# Patient Record
Sex: Female | Born: 1950 | Race: White | Hispanic: No | Smoking: Never smoker
Health system: Southern US, Community
[De-identification: ages and names within clinical notes are randomized; demographics above are authoritative.]

## PROBLEM LIST (undated history)

## (undated) DIAGNOSIS — G35 Multiple sclerosis: Secondary | ICD-10-CM

## (undated) DIAGNOSIS — G43909 Migraine, unspecified, not intractable, without status migrainosus: Secondary | ICD-10-CM

## (undated) DIAGNOSIS — E119 Type 2 diabetes mellitus without complications: Secondary | ICD-10-CM

## (undated) DIAGNOSIS — G2581 Restless legs syndrome: Secondary | ICD-10-CM

## (undated) DIAGNOSIS — G4730 Sleep apnea, unspecified: Secondary | ICD-10-CM

## (undated) DIAGNOSIS — J45909 Unspecified asthma, uncomplicated: Secondary | ICD-10-CM

## (undated) DIAGNOSIS — M1990 Unspecified osteoarthritis, unspecified site: Secondary | ICD-10-CM

## (undated) DIAGNOSIS — K759 Inflammatory liver disease, unspecified: Secondary | ICD-10-CM

## (undated) DIAGNOSIS — G5793 Unspecified mononeuropathy of bilateral lower limbs: Secondary | ICD-10-CM

## (undated) DIAGNOSIS — K219 Gastro-esophageal reflux disease without esophagitis: Secondary | ICD-10-CM

## (undated) DIAGNOSIS — R42 Dizziness and giddiness: Secondary | ICD-10-CM

## (undated) DIAGNOSIS — K8080 Other cholelithiasis without obstruction: Secondary | ICD-10-CM

## (undated) DIAGNOSIS — R109 Unspecified abdominal pain: Secondary | ICD-10-CM

## (undated) DIAGNOSIS — K8010 Calculus of gallbladder with chronic cholecystitis without obstruction: Secondary | ICD-10-CM

## (undated) DIAGNOSIS — N649 Disorder of breast, unspecified: Secondary | ICD-10-CM

## (undated) DIAGNOSIS — M5431 Sciatica, right side: Secondary | ICD-10-CM

## (undated) DIAGNOSIS — J029 Acute pharyngitis, unspecified: Secondary | ICD-10-CM

## (undated) HISTORY — PX: CHOLECYSTECTOMY: SHX55

## (undated) HISTORY — PX: SINUS EXPLORATION: SHX5214

## (undated) HISTORY — PX: APPENDECTOMY: SHX54

## (undated) HISTORY — PX: KNEE ARTHROSCOPY: SUR90

---

## 2008-08-16 ENCOUNTER — Ambulatory Visit: Payer: Self-pay | Admitting: Internal Medicine

## 2010-09-16 NOTE — Progress Notes (Signed)
See paper chart

## 2011-11-16 ENCOUNTER — Other Ambulatory Visit: Payer: Self-pay | Admitting: Bariatrics

## 2011-11-16 ENCOUNTER — Ambulatory Visit: Payer: Self-pay | Admitting: Bariatrics

## 2011-12-10 ENCOUNTER — Ambulatory Visit: Payer: Self-pay | Admitting: Bariatrics

## 2011-12-20 ENCOUNTER — Ambulatory Visit: Payer: Self-pay | Admitting: Bariatrics

## 2012-01-27 ENCOUNTER — Ambulatory Visit: Payer: Self-pay | Admitting: Gastroenterology

## 2012-02-03 ENCOUNTER — Ambulatory Visit: Payer: Self-pay | Admitting: Obstetrics and Gynecology

## 2012-02-23 ENCOUNTER — Ambulatory Visit: Payer: Self-pay | Admitting: Bariatrics

## 2012-03-01 ENCOUNTER — Inpatient Hospital Stay: Payer: Self-pay | Admitting: Bariatrics

## 2012-03-01 HISTORY — PX: ROUX-EN-Y GASTRIC BYPASS: SHX1104

## 2012-03-02 LAB — BASIC METABOLIC PANEL
Anion Gap: 7 (ref 7–16)
BUN: 10 mg/dL (ref 7–18)
Calcium, Total: 7.7 mg/dL — ABNORMAL LOW (ref 8.5–10.1)
Chloride: 105 mmol/L (ref 98–107)
Co2: 26 mmol/L (ref 21–32)
Creatinine: 0.74 mg/dL (ref 0.60–1.30)
EGFR (African American): >60
Glucose: 82 mg/dL (ref 65–99)
Osmolality: 274 (ref 275–301)
Potassium: 3.4 mmol/L — ABNORMAL LOW (ref 3.5–5.1)
Sodium: 138 mmol/L (ref 136–145)

## 2012-03-02 LAB — CBC WITH DIFFERENTIAL/PLATELET
Basophil #: 0.1 10*3/uL (ref 0.0–0.1)
Basophil %: 0.7 %
HGB: 12.2 g/dL (ref 12.0–16.0)
Lymphocyte #: 2.7 10*3/uL (ref 1.0–3.6)
Lymphocyte %: 26.2 %
MCH: 32.3 pg (ref 26.0–34.0)
MCV: 95 fL (ref 80–100)
Monocyte %: 8.1 %
Neutrophil #: 6.5 10*3/uL (ref 1.4–6.5)
Neutrophil %: 64 %
Platelet: 290 10*3/uL (ref 150–440)

## 2012-03-02 LAB — MAGNESIUM: Magnesium: 1.4 mg/dL — ABNORMAL LOW

## 2012-03-24 ENCOUNTER — Ambulatory Visit: Payer: Self-pay | Admitting: Bariatrics

## 2012-04-19 ENCOUNTER — Ambulatory Visit: Payer: Self-pay | Admitting: Bariatrics

## 2012-08-24 ENCOUNTER — Encounter

## 2012-12-04 NOTE — ED Notes (Signed)
Pt states that her nausea is better at this time but states that she is very cold and is requesting a warm blanket. Warm blanket given as requested. Pt denies other needs at this time. Will continue to monitor.     Chesley NoonHeather Brevin Mcfadden, RN  12/04/12 [phone]

## 2012-12-04 NOTE — ED Notes (Signed)
Pt states that she had chemo for breast cancer last Friday and developed nausea, vomiting x1 and diarrhea x15 today. Pt states that she feels very dehydrated. Pt states [facility]     Chesley NoonHeather Daelon Dunivan, RN  12/04/12 2110

## 2012-12-04 NOTE — ED Provider Notes (Signed)
Patient is a 62 year old female with breast cancer who presents with vomiting, diarrhea, and dehydration. The patient began having vomiting and diarrhea this morning.. She has had 15 diarrheal episodes and 1 vomiting episode today but had 3 vomiting episodes while in the ED. She also reports abdominal cramps.  She denies any abdominal pain.  She is currently receiving chemotherapy for breast cancer; she received her last treatment 9 days ago. She took zofran for her nausea symptoms but it has not relieved the symptoms. The patient has had nausea vomiting and diarrhea with her chemotherapy in the past but not to this extent.  She has taken Zofran 4 weeks.  Patient is on her fourth round of chemotherapy.    Patient's last chemotherapy was 9 days ago and her last Neulasta was 8 days ago patient is on Xarelto as she had she states to clots in her left arm and one clot around her port.  The ones in the arm have resolved she states that the one around her port stationary and they have no concerns about it but she is continued on Xarelto.       Patient is a 62 y.o. female presenting with nausea. The history is provided by the patient and the spouse. No language interpreter was used.   Nausea & Vomiting  Severity:  Moderate  Timing:  Constant  Number of daily episodes:  3  Progression:  Worsening  Chronicity:  New  Relieved by:  Nothing  Associated symptoms: diarrhea (15 times today)    Associated symptoms: no abdominal pain, no chills, no headaches and no sore throat        Review of Systems   Constitutional: Negative for fever and chills.   HENT: Negative for ear pain and sore throat.    Respiratory: Negative for shortness of breath.    Cardiovascular: Negative for chest pain.   Gastrointestinal: Positive for nausea, vomiting (1 time today) and diarrhea (15 times today). Negative for abdominal pain.   Genitourinary: Negative for dysuria and difficulty urinating.   Skin: Negative for rash.   Neurological: Negative for  syncope and headaches.   All other systems reviewed and are negative.          PAST MEDICAL HISTORY   has no past medical history on file.    PAST SURGICAL HISTORY   has no past surgical history on file.    FAMILY HISTORY  family history is not on file.    SOCIAL HISTORY       HOME MEDICATIONS     Prior to Admission medications    Not on File        ALLERGIES  is allergic to sulfa antibiotics.         Physical Exam   Constitutional: She is oriented to person, place, and time. She appears well-developed. No distress.   HENT:   Head: Normocephalic and atraumatic.   Right Ear: External ear normal.   Left Ear: External ear normal.   Mouth/Throat: Oropharynx is clear and moist. No oropharyngeal exudate, posterior oropharyngeal edema or posterior oropharyngeal erythema.   Eyes: Conjunctivae are normal. Pupils are equal, round, and reactive to light. Right eye exhibits no discharge. Left eye exhibits no discharge.   Neck: Normal range of motion. Neck supple. No JVD present.   Cardiovascular: Normal rate, regular rhythm, normal heart sounds and intact distal pulses.  Exam reveals no gallop and no friction rub.    No murmur heard.  Pulmonary/Chest: Effort normal  and breath sounds normal. No stridor. No respiratory distress. She has no wheezes. She has no rales.   Abdominal: Soft. Bowel sounds are normal. She exhibits no distension and no mass. There is no tenderness. There is no rigidity, no rebound and no guarding.   Musculoskeletal: Normal range of motion. She exhibits no edema or tenderness.   Neurological: She is alert and oriented to person, place, and time. She has normal strength. No cranial nerve deficit or sensory deficit. She exhibits normal muscle tone. GCS eye subscore is 4. GCS verbal subscore is 5. GCS motor subscore is 6.   Skin: Skin is warm and dry. No rash noted. She is not diaphoretic.        Psychiatric: She has a normal mood and affect. Her behavior is normal.       BP 144/96   Pulse 123   Temp(Src) 98.9  ??F (37.2 ??C) (Oral)   Resp 20   Ht 5\' 8"  (1.727 m)   Wt 145 lb (65.772 kg)   BMI 22.05 kg/m2   SpO2 98%    Procedures    MDM    Labs  Results for orders placed during the hospital encounter of 12/04/12   CBC WITH AUTO DIFFERENTIAL       Result Value Range    WBC 21.7 (*) 4.0 - 11.0 K/uL    RBC 3.35 (*) 4.00 - 5.20 M/uL    Hemoglobin 10.6 (*) 12.0 - 16.0 g/dL    Hematocrit 04.5 (*) 36.0 - 48.0 %    MCV 91.7  80.0 - 100.0 fL    MCH 31.6  26.0 - 34.0 pg    MCHC 34.5  31.0 - 36.0 g/dL    RDW 40.9 (*) 81.1 - 15.4 %    Platelets 404  135 - 450 K/uL    MPV 7.8  5.0 - 10.5 fL    Path Consult Yes      Neutrophils Relative 30.0      Lymphocytes Relative 20.0      Monocytes Relative 5.0      Eosinophils Relative Percent 0.0      Basophils Relative 0.0      Neutrophils Absolute 16.3 (*) 1.7 - 7.7 K/uL    Lymphocytes Absolute 4.3  1.0 - 5.1 K/uL    Monocytes Absolute 1.1  0.0 - 1.3 K/uL    Eosinophils Absolute 0.0  0.0 - 0.6 K/uL    Basophils Absolute 0.0  0.0 - 0.2 K/uL    Bands Relative 28 (*) 0 - 7 %    Metamyelocytes Relative 13 (*)     Myelocytes Relative 4 (*)     Toxic Granulation Present (*)     Anisocytosis 1+ (*)    COMPREHENSIVE METABOLIC PANEL       Result Value Range    Sodium 132 (*) 136 - 145 mmol/L    Potassium 3.1 (*) 3.5 - 5.1 mmol/L    Chloride 97 (*) 99 - 110 mmol/L    CO2 24  21 - 32 mmol/L    Glucose 160 (*) 70 - 99 mg/dL    BUN 15  7 - 20 mg/dL    CREATININE 1.1  0.6 - 1.2 mg/dL    GFR Non-African American 54 (*) >60    GFR African American >60  >60    Calcium 9.1  8.3 - 10.6 mg/dL    Total Protein 7.1  6.4 - 8.2 g/dL    Alb 4.0  3.4 - 5.0  g/dL    Albumin/Globulin Ratio 1.3  1.1 - 2.2    Total Bilirubin 0.3  0.0 - 1.0 mg/dL    Alkaline Phosphatase 129  40 - 129 U/L    ALT 13  10 - 40 U/L    AST 19  15 - 37 U/L    Globulin 3.1           Radiology      EKG Interpretation.        Emergency Department Course:  Patient has vomiting on arrival was given Zofran has recurrent vomiting and syncope and Phenergan  states that that has helped she does produce the diarrhea.  We discussed the possibility of colitis.  She declines CAT scan.  She has been pain-free in the emergency department and states that the only discomfort she's had at home is cramping sensation.   12:15 AM  Patient feels markedly improved.  She states that she has not had any further vomiting after the Phenergan and has no further diarrhea at this point.   12:50 AM  Patient continues to feel much improved is able to take by mouth fluids without any problems.  I discussed with the patient that I would call Dr. Selena Batten.  Her C. diff and leukocytes were negative.  I was concerned about her CBC and differential.  I would discuss this with Dr. Selena Batten.  If patient needs to be admitted she would like to be admitted to [facility] and is agreeable to transfer via squad.      1:09 AM  I spoke with Dr. Chipper Herb patient's presentation examination disposition were discussed and agreed upon.  I discussed my concern over the degree of bandemia.  I discussed whether or not she felt this was related to Neulasta she states that the patient doesn't have a fever usually she states they do not have a significant bandemia with Neulasta.  We discussed admission versus discharge home I felt given the significant bandemia patient would better be served by admission to the hospital.  Patient would like to be transferred to [facility] if she needs to be admitted.  We discussed this and Dr. Chipper Herb is new to this facility.  We discussed possible admission to the hospitalist with consult oncology.  I will speak to the hospitalist at crisis see if this is usual pattern.  Blood cultures will be done.    1:29 AM  She has had recurrent nausea and is given Phenergan.  She is quite agreeable with this time as she has had recurrence of her symptoms to being admitted and does wish to be admitted to [facility] she will consent to the blood cultures being drawn as she has a area with a significant  white count and bandemia I will go ahead and give her first dose of Cipro and Flagyl after cultures been obtained.    1:47 AM  Spoke with Dr. Chipper Herb again she is, was contacted by the bed person and she will call the oncology floor and patient will be signed to bed she will give orders.  I discussed with her the patient had recurrent nausea and is very willing to be admitted we have given her repeat Phenergan.  Also that I had ordered Cipro and Flagyl to cover the patient for the diarrhea until her cultures were available and she is also agreeable to this.      1:56 AM  Patient is better now after the second dose of Phenergan.  She again is agreeable  to admission and transfer she requests transfer to [facility].  Dr. Chipper Herb has made arrangements for her admission and this time we await bed assignment and transportation.  At this time care will be turned over to Dr. Rexene Agent patient's presentation examination and disposition and treatment have been discussed.  This document serves as a record of the services and decisions personally performed by myself, Cranston Neighbor, MD. It was created on my behalf by Sherrine Maples, a trained medical scribe. The creation of this document is based on my statements to the medical scribe. This dictation was generated by voice recognition computer software.  Although all attempts are made to edit the dictation for accuracy, there may be errors in the transcription that are not intended.              Cranston Neighbor, MD  12/05/12 2223

## 2012-12-04 NOTE — ED Notes (Signed)
Bedside commode at in room for pt if needed.     Chesley NoonHeather Tejal Monroy, RN  12/04/12 [phone]

## 2012-12-05 ENCOUNTER — Inpatient Hospital Stay: Admit: 2012-12-05 | Discharge: 2012-12-05 | Attending: Emergency Medicine

## 2012-12-05 LAB — CBC WITH AUTO DIFFERENTIAL
Bands Relative: 28 % — ABNORMAL HIGH (ref 0–7)
Basophils %: 0 %
Basophils Absolute: 0 10*3/uL (ref 0.0–0.2)
Eosinophils %: 0 %
Eosinophils Absolute: 0 10*3/uL (ref 0.0–0.6)
Hematocrit: 30.7 % — ABNORMAL LOW (ref 36.0–48.0)
Hemoglobin: 10.6 g/dL — ABNORMAL LOW (ref 12.0–16.0)
Lymphocytes %: 20 %
Lymphocytes Absolute: 4.3 10*3/uL (ref 1.0–5.1)
MCH: 31.6 pg (ref 26.0–34.0)
MCHC: 34.5 g/dL (ref 31.0–36.0)
MCV: 91.7 fL (ref 80.0–100.0)
MPV: 7.8 fL (ref 5.0–10.5)
Metamyelocytes Relative: 13 % — AB
Monocytes %: 5 %
Monocytes Absolute: 1.1 10*3/uL (ref 0.0–1.3)
Myelocyte Percent: 4 % — AB
Neutrophils %: 30 %
Neutrophils Absolute: 16.3 10*3/uL — ABNORMAL HIGH (ref 1.7–7.7)
Platelets: 404 10*3/uL (ref 135–450)
RBC: 3.35 M/uL — ABNORMAL LOW (ref 4.00–5.20)
RDW: 20.1 % — ABNORMAL HIGH (ref 12.4–15.4)
WBC: 21.7 10*3/uL — ABNORMAL HIGH (ref 4.0–11.0)

## 2012-12-05 LAB — COMPREHENSIVE METABOLIC PANEL
ALT: 13 U/L (ref 10–40)
AST: 19 U/L (ref 15–37)
Albumin/Globulin Ratio: 1.3 (ref 1.1–2.2)
Albumin: 4 g/dL (ref 3.4–5.0)
Alkaline Phosphatase: 129 U/L (ref 40–129)
BUN: 15 mg/dL (ref 7–20)
CO2: 24 mmol/L (ref 21–32)
Calcium: 9.1 mg/dL (ref 8.3–10.6)
Chloride: 97 mmol/L — ABNORMAL LOW (ref 99–110)
Creatinine: 1.1 mg/dL (ref 0.6–1.2)
GFR African American: >60 (ref >60)
GFR Non-African American: 54 — AB (ref >60)
Globulin: 3.1 g/dL
Glucose: 160 mg/dL — ABNORMAL HIGH (ref 70–99)
Potassium: 3.1 mmol/L — ABNORMAL LOW (ref 3.5–5.1)
Sodium: 132 mmol/L — ABNORMAL LOW (ref 136–145)
Total Bilirubin: 0.3 mg/dL (ref 0.0–1.0)
Total Protein: 7.1 g/dL (ref 6.4–8.2)

## 2012-12-05 LAB — PERIPHERAL BLOOD SMEAR, PATH REVIEW

## 2012-12-05 MED ORDER — LORAZEPAM 2 MG/ML IJ SOLN
2 MG/ML | Freq: Once | INTRAMUSCULAR | Status: AC
Start: 2012-12-05 — End: 2012-12-05
  Administered 2012-12-05: 07:00:00 via INTRAVENOUS

## 2012-12-05 MED ORDER — CIPROFLOXACIN IN D5W 400 MG/200ML IV SOLN
400 MG/200ML | Freq: Once | INTRAVENOUS | Status: AC
Start: 2012-12-05 — End: 2012-12-05
  Administered 2012-12-05: 08:00:00 via INTRAVENOUS

## 2012-12-05 MED ORDER — SODIUM CHLORIDE 0.9 % IV SOLN
0.9 % | INTRAVENOUS | Status: DC
Start: 2012-12-05 — End: 2012-12-05
  Administered 2012-12-05: 06:00:00 via INTRAVENOUS

## 2012-12-05 MED ORDER — PROMETHAZINE HCL 25 MG/ML IJ SOLN
25 MG/ML | Freq: Once | INTRAMUSCULAR | Status: AC
Start: 2012-12-05 — End: 2012-12-05
  Administered 2012-12-05: 06:00:00 via INTRAVENOUS

## 2012-12-05 MED ORDER — SODIUM CHLORIDE 0.9 % IV BOLUS
0.9 % | Freq: Once | INTRAVENOUS | Status: AC
Start: 2012-12-05 — End: 2012-12-04
  Administered 2012-12-05: 03:00:00 via INTRAVENOUS

## 2012-12-05 MED ORDER — METRONIDAZOLE IN NACL 5-0.79 MG/ML-% IV SOLN
5 mg/mL | Freq: Once | INTRAVENOUS | Status: AC
Start: 2012-12-05 — End: 2012-12-05
  Administered 2012-12-05: 07:00:00 via INTRAVENOUS

## 2012-12-05 MED ORDER — PROMETHAZINE HCL 25 MG/ML IJ SOLN
25 MG/ML | Freq: Once | INTRAMUSCULAR | Status: AC
Start: 2012-12-05 — End: 2012-12-04
  Administered 2012-12-05: 03:00:00 via INTRAVENOUS

## 2012-12-05 MED ADMIN — ondansetron (ZOFRAN) 4 MG/2ML injection: INTRAVENOUS | @ 02:00:00 | NDC 23155037831

## 2012-12-05 MED FILL — LORAZEPAM 2 MG/ML IJ SOLN: 2 MG/ML | INTRAMUSCULAR | Qty: 1

## 2012-12-05 MED FILL — METRONIDAZOLE IN NACL 5-0.79 MG/ML-% IV SOLN: 5 mg/mL | INTRAVENOUS | Qty: 100

## 2012-12-05 MED FILL — PROMETHAZINE HCL 25 MG/ML IJ SOLN: 25 MG/ML | INTRAMUSCULAR | Qty: 1

## 2012-12-05 MED FILL — CIPRO IN D5W 400 MG/200ML IV SOLN: 400 MG/200ML | INTRAVENOUS | Qty: 200

## 2012-12-05 MED FILL — ONDANSETRON HCL 4 MG/2ML IJ SOLN: 4 MG/2ML | INTRAMUSCULAR | Qty: 2

## 2012-12-05 NOTE — ED Notes (Signed)
Pt reports still feeling nauseated.  Plan to reevaluate, and draw cultures prior to antibiotics.    Marcille Buffy, RN  12/05/12 [phone]

## 2012-12-05 NOTE — ED Notes (Signed)
Report called Gena Fray.    Marcille Buffy, RN  12/05/12 [phone]

## 2012-12-05 NOTE — ED Notes (Signed)
After administering ordered dose of phenergan, explained to pt the need to draw blood cultures and that one set would be drawn from her port and the second she would have to be stuck.  Pt replied, " I just feel too sick right now to do it, and won't they do it at [facility] again?"  Informed Dr Ivan Croft of above info, she replied that the oncologist from [place] asked for them.  RN replied that a second attempt would be made to try and obtain cultures.    Marcille Buffy, RN  12/05/12 [phone]

## 2012-12-05 NOTE — ED Notes (Signed)
1 blood culture drawn from pt's port.    Marcille Buffy, RN  12/05/12 [phone]

## 2012-12-05 NOTE — ED Notes (Signed)
[facility] beds called back with bed number  Bed # 4081  Call [phone]    Zhara Gieske L. Eual Lindstrom  12/05/12 0203

## 2012-12-05 NOTE — ED Notes (Signed)
Pt given sprite and crackers. Pt states that she is feeling better and thinks that the phenergan has helped her more than the zofran did. Pt denies needs at this time. Husband remains at bedside. Call light in reach. Will continue to monitor.     Chesley NoonHeather Reyden Goupil, RN  12/05/12 0040

## 2012-12-07 LAB — ROTAVIRUS ANTIGEN, STOOL: Rotavirus: NEGATIVE

## 2013-03-28 ENCOUNTER — Encounter: Payer: Self-pay | Admitting: Neurology

## 2013-04-19 ENCOUNTER — Encounter: Payer: Self-pay | Admitting: Neurology

## 2013-05-12 NOTE — Progress Notes (Signed)
Subjective:      Patient ID: Vicki Hurst is a 63 y.o. female.    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  She was diagnosed with right breast cancer,s/p chemo,waiting for lumpectomy.  She developed a DVT at the site of the port and she has been on Xarelto.  She takes ativan periodically for panic disorder.  Review of Systems   Constitutional: Negative for fever, fatigue and unexpected weight change.   Respiratory: Negative for cough, chest tightness, shortness of breath and wheezing.    Cardiovascular: Negative for chest pain, palpitations and leg swelling.   Gastrointestinal: Negative for nausea, vomiting, abdominal pain, diarrhea and blood in stool.   Neurological: Negative for light-headedness.       Objective:   Physical Exam   Constitutional: She is oriented to person, place, and time. She appears well-developed and well-nourished.   HENT:   Head: Normocephalic and atraumatic.   Eyes: Pupils are equal, round, and reactive to light.   Neck: Normal range of motion. Neck supple. No thyromegaly present.   Cardiovascular: Normal rate, regular rhythm and normal heart sounds.  Exam reveals no gallop and no friction rub.    No murmur heard.  No carotid bruit   Pulmonary/Chest: Effort normal and breath sounds normal. No respiratory distress. She has no wheezes. She has no rales. She exhibits no tenderness.   Abdominal: Soft. Bowel sounds are normal. She exhibits no distension and no mass. There is no tenderness. There is no rebound and no guarding.   Musculoskeletal: She exhibits no edema.   Neurological: She is alert and oriented to person, place, and time.       Assessment:      1. DVT (deep venous thrombosis) (HCC)    2. Panic disorder             Plan:      Continue Xarelto.    Ativan 0.5 mg nightly prn #30 NRF.

## 2013-05-19 ENCOUNTER — Encounter: Payer: Self-pay | Admitting: Neurology

## 2013-06-07 NOTE — Telephone Encounter (Signed)
-----   Message from Daphane Shepherdara N Messina sent at 06/07/2013  2:51 PM EDT -----  Contact: Patient/[phone]      ----- Message -----     From: Janan Halteronnie E Hunt     Sent: 06/07/2013   2:39 PM       To: Daphane Shepherdara N Messina    Refill: Leaving for the weekend    Ativan 0.5 mg    [facility]    Lat appt  05/13/13  Future appt none

## 2013-06-07 NOTE — Telephone Encounter (Signed)
Too early

## 2013-06-09 NOTE — Telephone Encounter (Signed)
Ok

## 2013-06-09 NOTE — Telephone Encounter (Signed)
Last; 4/24

## 2013-06-09 NOTE — Telephone Encounter (Signed)
-----   Message from [facility] A Ranson sent at 06/09/2013  9:52 AM EDT -----  Contact: pt  She needs a refill on her ativan-[facility] at [phone]

## 2013-07-07 NOTE — Telephone Encounter (Signed)
-----   Message from TuttleBeth Braasch sent at 07/07/2013  9:22 AM EDT -----  Contact: Pharmacy  Patient needs a refill on Lorazepam 0.5 mg 1 hs.  Last seen on 05/12/13 and has no future appointment.  [facility] [phone]

## 2013-07-07 NOTE — Telephone Encounter (Signed)
Ok

## 2013-07-18 ENCOUNTER — Ambulatory Visit: Payer: Self-pay | Admitting: Internal Medicine

## 2013-07-26 ENCOUNTER — Encounter: Payer: Self-pay | Admitting: Internal Medicine

## 2013-08-02 MED ORDER — LORAZEPAM 0.5 MG PO TABS
0.5 MG | ORAL_TABLET | Freq: Every evening | ORAL | Status: DC
Start: 2013-08-02 — End: 2021-05-30

## 2013-08-02 NOTE — Telephone Encounter (Signed)
-----   Message from Garrett sent at 08/02/2013  9:14 AM EDT -----  Contact: [phone]  Refill:  Ativan    Pharmacy: [facility]/[phone]

## 2013-08-02 NOTE — Telephone Encounter (Signed)
Ok 1 rf

## 2013-08-19 ENCOUNTER — Encounter: Payer: Self-pay | Admitting: Internal Medicine

## 2013-09-19 ENCOUNTER — Encounter: Payer: Self-pay | Admitting: Internal Medicine

## 2013-12-07 ENCOUNTER — Ambulatory Visit: Payer: Self-pay | Admitting: Gastroenterology

## 2013-12-19 ENCOUNTER — Ambulatory Visit: Payer: Self-pay | Admitting: Gastroenterology

## 2014-05-04 ENCOUNTER — Other Ambulatory Visit: Payer: Self-pay | Admitting: Obstetrics and Gynecology

## 2014-05-04 DIAGNOSIS — Z1231 Encounter for screening mammogram for malignant neoplasm of breast: Secondary | ICD-10-CM

## 2014-05-10 ENCOUNTER — Emergency Department
Admit: 2014-05-10 | Disposition: A | Discharge status: Home / Self Care | Payer: Self-pay | Admitting: Emergency Medicine

## 2014-05-10 LAB — BASIC METABOLIC PANEL
Anion Gap: 7 (ref 7–16)
BUN: 9 mg/dL
CALCIUM: 8.6 mg/dL — AB
CHLORIDE: 100 mmol/L — AB
Co2: 32 mmol/L
Creatinine: 0.68 mg/dL
EGFR (African American): >60
EGFR (Non-African Amer.): >60
GLUCOSE: 227 mg/dL — AB
POTASSIUM: 4.5 mmol/L
SODIUM: 139 mmol/L

## 2014-05-10 LAB — CBC WITH DIFFERENTIAL/PLATELET
BASOS ABS: 0 10*3/uL (ref 0.0–0.1)
Basophil %: 0.3 %
Eosinophil #: 0.1 10*3/uL (ref 0.0–0.7)
Eosinophil %: 0.8 %
HCT: 36.9 % (ref 35.0–47.0)
HGB: 12.1 g/dL (ref 12.0–16.0)
LYMPHS PCT: 5.1 %
Lymphocyte #: 0.9 10*3/uL — ABNORMAL LOW (ref 1.0–3.6)
MCH: 31.8 pg (ref 26.0–34.0)
MCHC: 32.9 g/dL (ref 32.0–36.0)
MCV: 97 fL (ref 80–100)
MONO ABS: 1.5 x10 3/mm — AB (ref 0.2–0.9)
Monocyte %: 8.9 %
NEUTROS PCT: 84.9 %
Neutrophil #: 14.8 10*3/uL — ABNORMAL HIGH (ref 1.4–6.5)
Platelet: 233 10*3/uL (ref 150–440)
RBC: 3.81 10*6/uL (ref 3.80–5.20)
RDW: 13.4 % (ref 11.5–14.5)
WBC: 17.4 10*3/uL — ABNORMAL HIGH (ref 3.6–11.0)

## 2014-05-10 LAB — TSH: Thyroid Stimulating Horm: 0.571 u[IU]/mL

## 2014-05-10 LAB — TROPONIN I: Troponin-I: <0.03 ng/mL

## 2014-05-11 NOTE — Consult Note (Signed)
Brief Consult Note: Diagnosis: bradycardia.   Patient was seen by consultant.   Consult note dictated.   Comments: 64 yo female with obesity , htn , dm, dld, osa, s/p gastric bypass. developed bradycardia down to 55. pt taking inderal for headaches not for htn will stop and monitor. thaks for the consult. please call w questions. no need for further testing at this moment.  Electronic Signatures: Berlinda Last, Regan Rakers (MD)  (Signed 12-Feb-14 16:29)  Authored: Brief Consult Note   Last Updated: 12-Feb-14 16:29 by Berlinda Last, Regan Rakers (MD)

## 2014-05-11 NOTE — Consult Note (Signed)
PATIENT NAME:  Carrie Ashley, Carrie Ashley MR#:  [id number] DATE OF BIRTH:  04-Sep-1950  DATE OF CONSULTATION:  03/02/2012  REFERRING PHYSICIAN:  Tyrone Apple. Alva Garnet, MD CONSULTING PHYSICIAN:  Felipa Furnace, MD  REASON FOR CONSULTATION: Management of chronic medical conditions including hypertension and new onset bradycardia.   HISTORY OF PRESENT ILLNESS: The patient is a very nice 64 year old female who has a history of multiple medical problems including sleep apnea, asthma, GERD, hyperlipidemia, hypertension, restless leg syndrome, type 2 diabetes, peripheral neuropathy and obstructive sleep apnea. She also has multiple sclerosis. The patient has been obese for many years and has uncontrolled diabetes, for what she has consulted with Dr. Alva Garnet for bariatric surgery. 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 The patient has been recovering very well from the surgery. Her pain is well controlled. She is ambulating. She is on 2 liters of oxygen and her vital signs have been stable up until today. Her heart rate has been in the 60s to 70s, but at around 5:00 a.m. dropped down to 55. Her blood pressures had been stable in the 100s/60s and this afternoon went up to 151/69. I was asked by Dr. Alva Garnet to help facilitate treatment for her blood pressure and treatment for her bradycardia and other medical problems. She has already been seen by Dr. Tedd Sias for management of her diabetes, so we defer to Dr. Tedd Sias the management of this problem.   REVIEW OF SYSTEMS: A 12-system review of systems is done. CONSTITUTIONAL: The patient denies any fever, severe fatigue. She denies any significant pain.  ENT: No changes in her vision. No problems with her eyes like erythema.  NECK: Denies any neck pain. Denies any neck swelling.  RESPIRATORY: Denies any cough or sputum. Denies any hemoptysis. She does have obstructive sleep apnea and she takes inhalers due to her  asthma. No wheezing.  CARDIOVASCULAR: No chest pain. No palpitations. No edema. No syncope.  GASTROINTESTINAL: No abdominal pain, constipation or diarrhea. She had a bowel movement this morning. She has decreased appetite, but she is starting to eat a little bit more today.  EXTREMITIES: Denies any swelling. Denies any significant arthralgias.  HEMATOLOGIC AND LYMPHATIC: No bleeding or swollen glands.  ENDOCRINE: No polyuria, polydipsia or polyphagia. No cold or heat intolerance. Her diabetes is better controlled now with Dr. Tedd Sias on board.  PSYCHIATRIC: Denies any significant mood problems like significant depression.  SKIN: Denies any rashes or petechiae.  NEUROLOGIC: Denies any TIAs, numbness or tingling. She does have peripheral neuropathy, but it is not exacerbated at this moment.   PAST MEDICAL HISTORY:  1.  Insulin-dependent type 2 diabetes.  2.  Obstructive sleep apnea.  3.  Obesity.  4.  Multiple sclerosis.  5.  Asthma.  6.  GERD.  7.  Hypertension.  8.  Hyperlipidemia.  9.  Psoriasis.  10.  Restless leg syndrome.  11.  Hypertension.   ALLERGIES: BYETTA, MORPHINE, ZITHROMAX, BEE STINGS.   PAST SURGICAL HISTORY:  1.  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  2.  Pilonidal cyst removal.  3.  Cholecystectomy.  4.  Appendectomy.  5.  Right knee arthroscopy.  6.  Carpal tunnel syndrome.  7.  Sinus surgery.   SOCIAL HISTORY: The patient is not a smoker. She does not drink. She works at [employer]. She is married and lives with her husband.   FAMILY HISTORY: Positive for coronary artery disease in her mom who has  history of sudden death. She had more than 3 cardiac arrests and she died at the age of 14 from a cardiac arrest. Her father had diabetes and also colorectal cancer. She has a history of lung cancer in her aunt, and also hypertension runs in the family.   CURRENT MEDICATIONS: Include:  1.  Insulin sliding scale. 2.  Fluticasone with salmeterol 250/50  twice a day.  3.  Magnesium sulfate 2 grams given today. 4.  Hydromorphone PCA.  5.  Acetaminophen elixir. 6.  Meperidine p.r.n.  7.  Ondansetron p.r.n.  8.  Promethazine p.r.n.  9.  Propranolol 60 mg 3 times a day has been stopped.  10.  Gabapentin 300 mg q.i.d.  11.  Lexapro 10 mg daily.  12.  Ancef given as postop. 13.  She also takes enalapril and hydrochlorothiazide at home, which has been held, 10/25.  14.  Folic acid 1 mg once a day.  15.  Iron 65 mg once a day.  16.  Loratadine 10 mg once a day.  17.  Lovastatin 20 mg once a day.  18.  Metformin 500 mg twice daily.  19.  Methotrexate 2.5 mg 5 tablets once a week.  20.  Multivitamins.  21.  NovoLog 70/30, 20 units in a.m. and 40 units at bedtime.  22.  Nuvigil 250 mg once a day.  23.  Pramipexole 0.25 mg 2 hours before bedtime.  24.  Victoza 18 mg, inject 1.8 mg subcutaneously 2 times a day.  25.  Vitamin C.   All those home medications have been stopped.   PHYSICAL EXAMINATION:  VITAL SIGNS: Blood pressure 151/69, pulse 67, respiratory rate 18, temperature 97.4. Her lowest heart rate was 55. She is on 2 liters of oxygen, 96%.  GENERAL: The patient is alert, oriented x 3. No acute distress. No respiratory distress. Hemodynamically stable.  HEENT: Pupils are equal and reactive. Extraocular movements are intact. Mucosa moist. No oral lesions.  NECK: Supple. No JVD. No thyromegaly. No adenopathy. No carotid bruits.  CARDIOVASCULAR: Regular rate and rhythm. No murmurs, rubs or gallops. No tenderness to palpation of chest. No displacement of PMI.  LUNGS: Clear without any wheezing or crepitus. No dullness to percussion.  ABDOMEN: Soft, nontender, nondistended. No hepatosplenomegaly. No masses. Incisions from ports are clear. No signs of infection.  EXTREMITIES: No edema, no cyanosis, no clubbing. JOINTS: No swelling. No erythema.  PSYCHIATRIC: No anxiety or agitation. Alert, oriented x 3.  NEUROLOGIC: Cranial nerves II through  XII intact.   LYMPHATIC: Negative for lymphadenopathy in neck or supraclavicular areas.  SKIN: Without rashes or petechiae.   LABORATORY DATA: Potassium was 3.4 today (is being replaced), calcium 7.7, magnesium 1.4 (is being replaced), albumin 2.6. White count is 10.2, hemoglobin 12, platelets 290.   ASSESSMENT AND PLAN:  1.  Bradycardia. The patient is slightly bradycardic in the morning with a heart rate of 50. Her  heart rate usually runs in the 60s to 70s, despite the surgery and despite stress. She was taking beta blockers (Inderal). She is not taking the Inderal due to her blood pressure. It is mostly prescribed to her due to her headaches. At this moment, what we are going to do is we are going to continue telemetry monitoring, stop Inderal and monitor her heart rate and blood pressure, monitor her headaches as well. I do not think at this moment it is vital to have that medication. We can give her treatment for headaches if necessary. As well we  can add medications for blood pressure on a PRN basis, as the patient is not eating much. Again, the patient does not take this for blood pressure or heart disease, although it might be contributing to lower her blood pressure on a regular basis. I think that she will be able to start this medication back again on the outpatient basis once she fully recovers from her surgery and she is not taking any narcotics.  2.  Hypertension. The patient has high blood pressure today. We are going to add on hydralazine as needed, as we are holding some of her other blood pressure medications.  3.  Sleep apnea. Continue treatment with CPAP as needed.  4.  Multiple sclerosis, stable. No signs of exacerbation.  5.  Diabetes, managed by Dr. Tedd Sias at this moment. Continue to monitor.  6.  Other medical problems seem to be stable. Hypokalemia and hypomagnesemia are being replaced. We are going to continue to follow along. We had a long discussion about her care and future  recommendations about management of blood pressure, headaches and other chronic comorbidities.   TIME SPENT: I spent about 45 minutes with this patient.     ____________________________ Felipa Furnace, MD rsg:jm D: 03/02/2012 16:41:00 ET T: 03/02/2012 18:53:29 ET JOB#: [id number]  cc: Felipa Furnace, MD, <Dictator> Reyah Streeter Juanda Chance MD ELECTRONICALLY SIGNED 03/09/2012 14:16

## 2014-05-11 NOTE — Op Note (Signed)
PATIENT NAME:  Hulme, Isys W MR#: [id number], COGBURNOF BIRTH:  Nov 02, 1950  DATE OF PROCEDURE:  03/01/2012  PROCEDURE PERFORMED: Laparoscopic gastric bypass with intraoperative endoscopy, lysis of adhesions and repair of hiatal hernia.  PHYSICIAN IN ATTENDANCE: Casimiro Needle A. Alva Garnet, MD  ASSISTANT: Murvin Natal. Elledge, PA   PREOPERATIVE DIAGNOSIS: Morbid obesity with a BMI of 43, associated with multiple comorbidities including hypertension, diabetes and obstructive sleep apnea.   POSTOPERATIVE DIAGNOSES: Morbid obesity with a BMI of 43, associated with multiple comorbidities including hypertension, diabetes and obstructive sleep apnea, with omental adhesions across the lower abdomen.   PROCEDURE NOTE: The patient was brought to the operating room and placed in the supine position. General anesthesia was obtained with orotracheal intubation. The patient had a Foley catheter inserted sterilely. TED hose and Thromboguards were applied and a foot board applied at the end of the operative bed. The chest and abdomen were sterilely prepped and draped. A 5 mm Optiview trocar was introduced under direct visualization into the abdominal cavity. The patient had had insufflation with carbon dioxide. The patient with omental adhesions across the upper abdomen at the site of her prior open cholecystectomy. This prevented initial trocar placement in the right upper quadrant. A 5 mm trocar introduced in the left upper abdomen. Omental adhesions across the right upper abdomen were then taken down by use of the Harmonic scalpel and use of blunt dissection. The patient then had 2 trocars introduced in the right upper abdomen. Additional trocar introduced in the right lateral abdomen. Efforts were made to mobilize the omentum; however, there were noted to be diffuse omental adhesions across the lower abdomen. One of the original 5 mm trocar sites was repositioned to the right lower quadrant, and with traction on the omentum,  these omental adhesions were freed from epiploic appendages involving the sigmoid colon and the lower midline anterior abdominal wall. This ultimately resulted in freeing up the omentum, which was delivered into the upper abdomen. The ligament of Treitz was identified and the bowel followed distally 50 cm, at which point it was divided with a white load GIA stapler. The mesentery divided by use of the Harmonic scalpel. The bowel was followed distally 100 cm, at which point a side-to-side jejunal/jejunal anastomosis was configured. This was accomplished with enterotomies on the antimesenteric border of the biliary limb and the common channel portion of jejunum. A white load stapler was used to create a common lumen. The resulting enterotomy closed with a repeat firing of white load GIA stapler. Anti-torsion sutures were placed distally in anastomosis. The patient then had closure of the mesenteric window with a running 2-0 Ethibond suture. The transverse colon omentum was then divided by use of the Harmonic scalpel, and the mobilized Roux limb was secured to the anterior gastric wall. A Nathanson liver retractor introduced through a subxiphoid wound. Upon elevation of the left lobe of the liver, the patient was noted to have a prominent fat pad in this area. This was mobilized away from the angle of His and the undersurface of the left hemidiaphragm and then resected, a portion of this being removed from the abdominal cavity. Preoperatively, the patient was noted to have a distal esophageal stricture and concerns were that there was underlying hiatal hernia. Mobilization of the region of the stomach near the angle of His confirmed the apex of the stomach sitting within the lower mediastinum. At this point, it was decided to proceed with repair of the hiatal hernia. The patient had  division of portions of the gastrohepatic ligament. The patient then had division of the peritoneum just lateral to the right crus, and  blunt dissection was used to initiate reduction of the herniated lesser sac fatty tissue. Further circumferential mobilization of the distal esophagus was performed, freeing it from the underlying aorta and the pleural surfaces on the right and left side, along with freeing it from the overlying pericardium. This ultimately resulted in delivery of approximately 1 cm of esophagus lying in the abdominal cavity, and a posterior crural repair performed with interrupted 0 Ethibond suture.   Next, the patient had division of the arcade vessels immediately adjacent to the lesser curve of the stomach, approximately 5 cm inferior to the GE junction. A gold load GIA stapler reinforced with Seamguard was directed across the upper stomach, initiating creation of a proximal gastric pouch. Next, a vertical line of staples was placed, brought out immediately lateral to the angle of His. The patient then had a posterior distal gastrojejunostomy accomplished with enterotomies on the distal posterior aspect of the gastric pouch and opposing portion of the mobilized Roux limb. A blue load stapler fired at the 2 cm mark was used to initiate the common lumen. The resulting enterotomy closed with a running 2-0 Polysorb suture. The staple and suture lines then reinforced with a running 2-0 Polysorb suture. This was accomplished with a 34 French bougie through the region of the anastomosis, and this was used as an aid in the sizing of the anastomosis. The patient, at this point, had occlusion of the bowel distal anastomosis and intraoperative endoscopy performed. The patient had a negative air leak test with instillation of saline. The patient then had placement of the prior divided limbs of omentum over the area of the anastomosis. This was secured to the anterior aspect of the anastomosis with Surgidac suture. The Vonita Moss defect was closed with a running 2-0 Surgidac suture. Because of the patient's diminished musculature given her  central obesity, the fascial defect of the 12 mm trocar site in the right upper quadrant was closed with 0 Vicryl suture passed by way of a needle suture passing system under direct visualization. The pneumoperitoneum relieved, the trocars removed, the wounds injected with 0.25% Marcaine and closed with 4-0 Monocryl in the dermis followed by Dermabond. The patient was allowed to recover at this point with minimal blood loss.    ____________________________ Tyrone Apple Alva Garnet, MD mat:OSi D: 03/02/2012 06:11:00 ET T: 03/02/2012 06:39:18 ET JOB#: [id number]  cc: Casimiro Needle A. Alva Garnet, MD, <Dictator> Marya Amsler. Dareen Piano, MD A. Wendall Mola, MD Everette Rank MD ELECTRONICALLY SIGNED 03/03/2012 13:49

## 2014-05-11 NOTE — Consult Note (Signed)
PATIENT NAME:  Carrie Ashley, Carrie Ashley MR#:  [id number] DATE OF BIRTH:  [birth year]  DATE OF CONSULTATION:  03/01/2012  REFERRING PHYSICIAN:  Sanjuana Kava, PA CONSULTING PHYSICIAN:  A. Wendall Mola, MD  CHIEF COMPLAINT: Diabetes.   HISTORY OF PRESENT ILLNESS: This is a 64 year old female with obesity and type 2 diabetes, who is status post Roux-en-Y gastric bypass surgery earlier today. I follow with her in the endocrine clinic. Her diabetes is complicated by peripheral neuropathy. Her diabetes had been well controlled with an A1c of 7% in January 2014. Her outpatient regimen consisted of NovoLog 70/30 mix 65 units each morning and 80 units at supper, Victoza 1.8 mg daily and Fortamet 1000 mg b.i.d. Fasting sugar today was 103 and a postoperative sugar was 149. She is currently n.p.o. She has no appetite. She denies any abdominal pain or nausea.   PAST MEDICAL HISTORY: 1.  Type 2 diabetes.  2.  Diabetic peripheral neuropathy.  3.  Obstructive sleep apnea.  4.  Multiple sclerosis.  5.  Psoriasis.  6.  Asthma.  7.  GERD.  8.  Hyperlipidemia.  9.  Hypertension.  10.  History of restless leg syndrome.   PAST SURGICAL HISTORY: 1.  Roux-en-Y gastric bypass surgery.  2.  Sinus surgery.  3.  Right knee arthroscopy.  4.  Carpal tunnel repair.  5.  Appendectomy.  6.  Cholecystectomy.  7.  Removal of pilonidal cyst.   INPATIENT MEDICATIONS:  1.  Ancef 2 grams q.8 hours.  2.  Lexapro 10 mg daily.  3.  Gabapentin 300 mg q.i.d.  4.  Propranolol 60 mg t.i.d.  5.  Lovenox 40 mg subcu b.i.d.  6.  Advair Diskus 250/50, 1 puff b.i.d.  7.  Normal saline 0.45 plus KCl 20 mEq at 125 mL/h.  8.  Zofran 4 mg p.r.n. nausea.  9.  NovoLog insulin sliding scale before meals and at bedtime, 2 units per 50 over a target of 150.   SOCIAL HISTORY: No tobacco or alcohol use. She is employed at [employer]. She is married.   FAMILY HISTORY: Positive for diabetes, coronary disease, colorectal cancer, lung cancer and  hypertension.   ALLERGIES: EXENATIDE, MORPHINE, NIACIN, AZITHROMYCIN.   REVIEW OF SYSTEMS:  GENERAL: She is fatigued. She denies fevers.  HEENT: She denies blurred vision. She denies sore throat.  NECK: She denies neck pain or dysphagia.  CARDIAC: She denies chest pain or palpitation.  PULMONARY: She denies cough or shortness of breath.  ABDOMEN: She denies abdominal pain or nausea. She has no appetite.  EXTREMITIES: She denies leg swelling. She denies arthralgias or myalgias.  SKIN: She denies rash or easy bruisability.  HEME: She denies recent bleeding or easy bruisability.  ENDO: She denies heat or cold intolerance.   PHYSICAL EXAMINATION: VITAL SIGNS: Height 60 inches, weight 225 pounds, BMI 43.9. Temperature 97.3, pulse 70, respirations 18, blood pressure 109/62, pulse ox 96% on 1.5 liters O2 by nasal cannula.  GENERAL: Obese white female in no acute distress.  HEENT: EOMI. Oropharynx is clear. Mucous membranes moist.  NECK: Supple.  CARDIAC: Regular rate and rhythm. No carotid bruit.  PULMONARY: Clear bilaterally anteriorly. The patient was not moved.  ABDOMEN: Diffusely distended. Positive bowel sounds.  EXTREMITIES: No edema is present. No focal joint swelling.  PSYCHIATRIC: Alert and oriented x 3, calm and cooperative.   ASSESSMENT: This is a 64 year old female with morbid obesity and type 2 diabetes complicated by peripheral neuropathy, status post Roux-en-Y gastric bypass surgery earlier today.  RECOMMENDATIONS:  Agree with use of just the NovoLog insulin sliding scale at this time. We will change to q.6 hours rather than before meals and at bedtime. I will follow along with you and consider adding basal insulin if indicated.   Thank you for the kind request for consultation.   ____________________________ A. Wendall Mola, MD ams:cs D: 03/01/2012 16:52:32 ET T: 03/01/2012 20:20:53 ET JOB#: [id number]  cc: A. Wendall Mola, MD, <Dictator> Macy Mis  MD ELECTRONICALLY SIGNED 03/02/2012 15:33

## 2014-07-18 ENCOUNTER — Ambulatory Visit: Payer: 59 | Attending: Neurology | Admitting: Physical Therapy

## 2014-07-18 DIAGNOSIS — M6281 Muscle weakness (generalized): Secondary | ICD-10-CM | POA: Insufficient documentation

## 2014-07-18 DIAGNOSIS — M545 Low back pain, unspecified: Secondary | ICD-10-CM

## 2014-07-18 DIAGNOSIS — G35 Multiple sclerosis: Secondary | ICD-10-CM | POA: Insufficient documentation

## 2014-07-18 DIAGNOSIS — R5382 Chronic fatigue, unspecified: Secondary | ICD-10-CM | POA: Insufficient documentation

## 2014-07-19 NOTE — Therapy (Signed)
[facility] [address] [place], [place], [address] Phone: [phone]   Fax:  [phone]  Physical Therapy Evaluation  Patient Details  Name: Carrie Ashley MRN: [id number] Date of Birth: 12-Feb-1950 Referring Provider:  Olena Heckle *  Encounter Date: 07/18/2014      PT End of Session - 07/19/14 1239    Visit Number 1   Number of Visits 1   PT Start Time 1302   PT Stop Time 1445   PT Time Calculation (min) 103 min   Activity Tolerance Patient limited by fatigue;Patient limited by pain   Behavior During Therapy [facility] for tasks assessed/performed      No past medical history on file.  No past surgical history on file.  There were no vitals filed for this visit.  Visit Diagnosis:  Muscle weakness (generalized)  Chronic fatigue  Bilateral low back pain without sciatica      Subjective Assessment - 07/19/14 1237    Subjective See FCE   Currently in Pain? Yes   Pain Score 5    Pain Location Back   Pain Orientation Lower   Pain Type Chronic pain           [facility] PT Assessment - 07/19/14 0001    Assessment   Medical Diagnosis Multiple Sclerosis                  Plan - 07/19/14 1240    Clinical Impression Statement See FCE   PT Frequency 1x / week         Problem List There are no active problems to display for this patient.   Cammie Mcgee, PT, DPT # [phone]   07/19/2014, 12:42 PM  [facility] [address] [place], [place], [address] Phone: [phone]   Fax:  [phone]

## 2014-07-24 ENCOUNTER — Ambulatory Visit
Admission: RE | Admit: 2014-07-24 | Discharge: 2014-07-24 | Disposition: A | Discharge status: Home / Self Care | Payer: 59 | Source: Ambulatory Visit | Attending: Obstetrics and Gynecology | Admitting: Obstetrics and Gynecology

## 2014-07-24 DIAGNOSIS — Z1231 Encounter for screening mammogram for malignant neoplasm of breast: Secondary | ICD-10-CM | POA: Diagnosis present

## 2014-11-07 ENCOUNTER — Encounter

## 2014-11-07 ENCOUNTER — Inpatient Hospital Stay: Primary: Family

## 2014-11-07 ENCOUNTER — Encounter: Admit: 2014-11-07 | Primary: Family

## 2014-11-07 DIAGNOSIS — M5431 Sciatica, right side: Secondary | ICD-10-CM

## 2015-01-14 ENCOUNTER — Encounter: Payer: Self-pay | Admitting: Emergency Medicine

## 2015-01-14 ENCOUNTER — Emergency Department: Payer: 59

## 2015-01-14 ENCOUNTER — Emergency Department
Admission: EM | Admit: 2015-01-14 | Discharge: 2015-01-14 | Disposition: A | Discharge status: Home / Self Care | Payer: 59 | Attending: Emergency Medicine | Admitting: Emergency Medicine

## 2015-01-14 DIAGNOSIS — E119 Type 2 diabetes mellitus without complications: Secondary | ICD-10-CM | POA: Insufficient documentation

## 2015-01-14 DIAGNOSIS — J0100 Acute maxillary sinusitis, unspecified: Secondary | ICD-10-CM | POA: Diagnosis not present

## 2015-01-14 DIAGNOSIS — R509 Fever, unspecified: Secondary | ICD-10-CM | POA: Diagnosis present

## 2015-01-14 HISTORY — DX: Sleep apnea, unspecified: G47.30

## 2015-01-14 HISTORY — DX: Multiple sclerosis: G35

## 2015-01-14 HISTORY — DX: Migraine, unspecified, not intractable, without status migrainosus: G43.909

## 2015-01-14 HISTORY — DX: Type 2 diabetes mellitus without complications: E11.9

## 2015-01-14 MED ORDER — AMOXICILLIN-POT CLAVULANATE 875-125 MG PO TABS
1.0000 | ORAL_TABLET | Freq: Once | ORAL | Status: AC
  Administered 2015-01-14: 1 via ORAL

## 2015-01-14 MED ORDER — AMOXICILLIN-POT CLAVULANATE 875-125 MG PO TABS
ORAL_TABLET | ORAL | Status: AC
  Administered 2015-01-14: 1 via ORAL
  Filled 2015-01-14: qty 1

## 2015-01-14 MED ORDER — IBUPROFEN 600 MG PO TABS
ORAL_TABLET | ORAL | Status: AC
  Administered 2015-01-14: 600 mg via ORAL
  Filled 2015-01-14: qty 1

## 2015-01-14 MED ORDER — IBUPROFEN 400 MG PO TABS
600.0000 mg | ORAL_TABLET | Freq: Once | ORAL | Status: AC
  Administered 2015-01-14: 600 mg via ORAL

## 2015-01-14 MED ORDER — AMOXICILLIN-POT CLAVULANATE 875-125 MG PO TABS: 1.0000 | ORAL_TABLET | Freq: Two times a day (BID) | ORAL | Status: AC

## 2015-01-14 NOTE — ED Notes (Addendum)
Pt c/o congestion, fever, headache, weakness, and nausea since yesterday. States she feeling like she is going to pass out. Pt says she has taken clariton at home with no relief.

## 2015-01-14 NOTE — Discharge Instructions (Signed)

## 2015-01-14 NOTE — ED Provider Notes (Signed)
[facility] Emergency Department Provider Note  ____________________________________________  Time seen: 9:30 PM  I have reviewed the triage vital signs and the nursing notes.   HISTORY  Chief Complaint URI    HPI Carrie Ashley is a 64 y.o. female presents with nasal congestion and fever facial discomfort since yesterday. Patient states that she's had a history of sinusitis requiring 2 previous sinus surgeries.     Past Medical History  Diagnosis Date  . MS (multiple sclerosis) (HCC)   . Diabetes mellitus without complication (HCC)   . Sleep apnea   . Migraines     There are no active problems to display for this patient.   Past Surgical History  Procedure Laterality Date  . Sinus exploration    . Bipass    . Knee arthroscopy    . Appendectomy    . Cholecystectomy      No current outpatient prescriptions on file.  Allergies Byetta 10 mcg pen; Morphine and related; and Niacin and related  History reviewed. No pertinent family history.  Social History Social History  Substance Use Topics  . Smoking status: Never Smoker   . Smokeless tobacco: None  . Alcohol Use: No    Review of Systems  Constitutional: Positive for fever. Eyes: Negative for visual changes. ENT: Negative for sore throat. Positive congestion and facial discomfort Cardiovascular: Negative for chest pain. Respiratory: Negative for shortness of breath. Gastrointestinal: Negative for abdominal pain, vomiting and diarrhea. Genitourinary: Negative for dysuria. Musculoskeletal: Negative for back pain. Skin: Negative for rash. Neurological: Negative for headaches, focal weakness or numbness.   10-point ROS otherwise negative.  ____________________________________________   PHYSICAL EXAM:  VITAL SIGNS: ED Triage Vitals  Enc Vitals Group     BP 01/14/15 2047 151/60 mmHg     Pulse Rate 01/14/15 2047 95     Resp 01/14/15 2047 22     Temp 01/14/15 2047 100 F  (37.8 C)     Temp Source 01/14/15 2047 Oral     SpO2 01/14/15 2047 96 %     Weight 01/14/15 2047 182 lb (82.555 kg)     Height 01/14/15 2047 5\' 1"  (1.549 m)     Head Cir --      Peak Flow --      Pain Score 01/14/15 2048 10     Pain Loc --      Pain Edu? --      Excl. in GC? --      Constitutional: Alert and oriented. Well appearing and in no distress. Eyes: Conjunctivae are normal. PERRL. Normal extraocular movements. ENT   Head: Normocephalic and atraumatic.   Nose: No congestion/rhinnorhea. Pain with percussion of maxillary sinuses   Mouth/Throat: Mucous membranes are moist.   Neck: No stridor. Hematological/Lymphatic/Immunilogical: No cervical lymphadenopathy. Cardiovascular: Normal rate, regular rhythm. Normal and symmetric distal pulses are present in all extremities. No murmurs, rubs, or gallops. Respiratory: Normal respiratory effort without tachypnea nor retractions. Breath sounds are clear and equal bilaterally. No wheezes/rales/rhonchi. Gastrointestinal: Soft and nontender. No distention. There is no CVA tenderness. Genitourinary: deferred Musculoskeletal: Nontender with normal range of motion in all extremities. No joint effusions.  No lower extremity tenderness nor edema. Neurologic:  Normal speech and language. No gross focal neurologic deficits are appreciated. Speech is normal.  Skin:  Skin is warm, dry and intact. No rash noted. Psychiatric: Mood and affect are normal. Speech and behavior are normal. Patient exhibits appropriate insight and judgment.     INITIAL  IMPRESSION / ASSESSMENT AND PLAN / ED COURSE  Pertinent labs & imaging results that were available during my care of the patient were reviewed by me and considered in my medical decision making (see chart for details).  Patient received Augmentin 875 mg and ibuprofen 600 mg 4 sinusitis.  ____________________________________________   FINAL CLINICAL IMPRESSION(S) / ED DIAGNOSES  Final  diagnoses:  Acute maxillary sinusitis, recurrence not specified      Darci Current, MD 01/17/15 [phone]

## 2015-01-14 NOTE — ED Notes (Signed)
pt reports headache and congestion, runny nose and cough for the past couple days but worse today

## 2015-01-23 ENCOUNTER — Emergency Department
Admission: EM | Admit: 2015-01-23 | Discharge: 2015-01-23 | Disposition: A | Discharge status: Home / Self Care | Payer: 59 | Attending: Emergency Medicine | Admitting: Emergency Medicine

## 2015-01-23 ENCOUNTER — Emergency Department: Payer: 59

## 2015-01-23 ENCOUNTER — Encounter: Payer: Self-pay | Admitting: Emergency Medicine

## 2015-01-23 DIAGNOSIS — Z792 Long term (current) use of antibiotics: Secondary | ICD-10-CM | POA: Insufficient documentation

## 2015-01-23 DIAGNOSIS — E119 Type 2 diabetes mellitus without complications: Secondary | ICD-10-CM | POA: Diagnosis not present

## 2015-01-23 DIAGNOSIS — R0602 Shortness of breath: Secondary | ICD-10-CM | POA: Diagnosis present

## 2015-01-23 DIAGNOSIS — J4521 Mild intermittent asthma with (acute) exacerbation: Secondary | ICD-10-CM | POA: Insufficient documentation

## 2015-01-23 HISTORY — DX: Unspecified asthma, uncomplicated: J45.909

## 2015-01-23 MED ORDER — PREDNISONE 10 MG PO TABS: ORAL_TABLET | ORAL | Status: DC

## 2015-01-23 MED ORDER — AZITHROMYCIN 250 MG PO TABS: ORAL_TABLET | ORAL | Status: DC

## 2015-01-23 MED ORDER — GUAIFENESIN-CODEINE 100-10 MG/5ML PO SOLN: 10.0000 mL | ORAL | Status: DC | PRN

## 2015-01-23 NOTE — ED Provider Notes (Signed)
[facility] Emergency Department Provider Note  ____________________________________________  Time seen: Approximately 12:15 PM  I have reviewed the triage vital signs and the nursing notes.   HISTORY  Chief Complaint Shortness of Breath   HPI Carrie Ashley is a 65 y.o. female presents via EMS for evaluation of cold and asthma. Patient states that she had an asthma attack at work no treatment given prior to arrival. Complains of having some shortness of breath.   Past Medical History  Diagnosis Date  . MS (multiple sclerosis) (HCC)   . Diabetes mellitus without complication (HCC)   . Sleep apnea   . Migraines   . Asthma     There are no active problems to display for this patient.   Past Surgical History  Procedure Laterality Date  . Sinus exploration    . Bipass    . Knee arthroscopy    . Appendectomy    . Cholecystectomy      Current Outpatient Rx  Name  Route  Sig  Dispense  Refill  . amoxicillin-clavulanate (AUGMENTIN) 875-125 MG tablet   Oral   Take 1 tablet by mouth 2 (two) times daily.   20 tablet   0   . azithromycin (ZITHROMAX Z-PAK) 250 MG tablet      Take 2 tablets (500 mg) on  Day 1,  followed by 1 tablet (250 mg) once daily on Days 2 through 5.   6 each   0   . guaiFENesin-codeine 100-10 MG/5ML syrup   Oral   Take 10 mLs by mouth every 4 (four) hours as needed for cough.   180 mL   0   . predniSONE (DELTASONE) 10 MG tablet      Take six tablets on day 1, then decrease by 1 tablet daily   21 tablet   0     Allergies Byetta 10 mcg pen; Morphine and related; and Niacin and related  No family history on file.  Social History Social History  Substance Use Topics  . Smoking status: Never Smoker   . Smokeless tobacco: None  . Alcohol Use: No    Review of Systems Constitutional: No fever/chills Eyes: No visual changes. ENT: No sore throat. Cardiovascular: Denies chest pain. Respiratory: Positive  shortness of breath. Gastrointestinal: No abdominal pain.  No nausea, no vomiting.  No diarrhea.  No constipation. Genitourinary: Negative for dysuria. Musculoskeletal: Negative for back pain. Skin: Negative for rash. Neurological: Negative for headaches, focal weakness or numbness.  10-point ROS otherwise negative.  ____________________________________________   PHYSICAL EXAM:  VITAL SIGNS: ED Triage Vitals  Enc Vitals Group     BP 01/23/15 1052 144/67 mmHg     Pulse Rate 01/23/15 1052 77     Resp 01/23/15 1052 20     Temp 01/23/15 1052 98 F (36.7 C)     Temp Source 01/23/15 1052 Oral     SpO2 01/23/15 1052 98 %     Weight 01/23/15 1052 180 lb 3.2 oz (81.738 kg)     Height 01/23/15 1052  (1.549 m)     Head Cir --      Peak Flow --      Pain Score --      Pain Loc --      Pain Edu? --      Excl. in GC? --     Constitutional: Alert and oriented. Well appearing and in no acute distress. Eyes: Conjunctivae are normal. PERRL. EOMI. Head: Atraumatic. Nose:  No congestion/rhinnorhea. Mouth/Throat: Mucous membranes are moist.  Oropharynx non-erythematous. Neck: No stridor.   Cardiovascular: Normal rate, regular rhythm. Grossly normal heart sounds.  Good peripheral circulation. Respiratory: Normal respiratory effort.  No retractions. Lungs CTAB. Gastrointestinal: Soft and nontender. No distention. No abdominal bruits. No CVA tenderness. Musculoskeletal: No lower extremity tenderness nor edema.  No joint effusions. Neurologic:  Normal speech and language. No gross focal neurologic deficits are appreciated. No gait instability. Skin:  Skin is warm, dry and intact. No rash noted. Psychiatric: Mood and affect are normal. Speech and behavior are normal.  ____________________________________________   LABS (all labs ordered are listed, but only abnormal results are displayed)  Labs Reviewed - No data to display  RADIOLOGY  FINDINGS: Normal heart size and mediastinal  contours. No acute infiltrate or edema. No effusion or pneumothorax. No acute osseous findings.  IMPRESSION: No active cardiopulmonary disease. ____________________________________________   PROCEDURES  Procedure(s) performed: None  Critical Care performed: No  ____________________________________________   INITIAL IMPRESSION / ASSESSMENT AND PLAN / ED COURSE  Pertinent labs & imaging results that were available during my care of the patient were reviewed by me and considered in my medical decision making (see chart for details).  Acute upper respiratory infection. Rx given for Zithromax 5 days, Robitussin-AC as needed for cough, tapering prednisone dose 6 days, continue albuterol inhaler. Follow up with PCP or return to the ER with any worsening symptomology. ____________________________________________   FINAL CLINICAL IMPRESSION(S) / ED DIAGNOSES  Final diagnoses:  RAD (reactive airway disease) with wheezing, mild intermittent, with acute exacerbation      Evangeline Dakin, PA-C 01/23/15 1329  Sharman Cheek, MD 01/23/15 1529

## 2015-01-23 NOTE — ED Notes (Signed)
Lungs sound clear in triage.

## 2015-01-23 NOTE — ED Notes (Signed)
Pt presents with shortness of breath, has been sick with a cold for about one week. Pt brought in today by ems, states she had an asthma attack at work, pt was not given any meds en route. No resp distress noted in triage.

## 2015-01-23 NOTE — Discharge Instructions (Signed)
Asthma, Adult Asthma is a recurring condition in which the airways tighten and narrow. Asthma can make it difficult to breathe. It can cause coughing, wheezing, and shortness of breath. Asthma episodes, also called asthma attacks, range from minor to life-threatening. Asthma cannot be cured, but medicines and lifestyle changes can help control it. CAUSES Asthma is believed to be caused by inherited (genetic) and environmental factors, but its exact cause is unknown. Asthma may be triggered by allergens, lung infections, or irritants in the air. Asthma triggers are different for each person. Common triggers include:   Animal dander.  Dust mites.  Cockroaches.  Pollen from trees or grass.  Mold.  Smoke.  Air pollutants such as dust, household cleaners, hair sprays, aerosol sprays, paint fumes, strong chemicals, or strong odors.  Cold air, weather changes, and winds (which increase molds and pollens in the air).  Strong emotional expressions such as crying or laughing hard.  Stress.  Certain medicines (such as aspirin) or types of drugs (such as beta-blockers).  Sulfites in foods and drinks. Foods and drinks that may contain sulfites include dried fruit, potato chips, and sparkling grape juice.  Infections or inflammatory conditions such as the flu, a cold, or an inflammation of the nasal membranes (rhinitis).  Gastroesophageal reflux disease (GERD).  Exercise or strenuous activity. SYMPTOMS Symptoms may occur immediately after asthma is triggered or many hours later. Symptoms include:  Wheezing.  Excessive nighttime or early morning coughing.  Frequent or severe coughing with a common cold.  Chest tightness.  Shortness of breath. DIAGNOSIS  The diagnosis of asthma is made by a review of your medical history and a physical exam. Tests may also be performed. These may include:  Lung function studies. These tests show how much air you breathe in and out.  Allergy  tests.  Imaging tests such as X-rays. TREATMENT  Asthma cannot be cured, but it can usually be controlled. Treatment involves identifying and avoiding your asthma triggers. It also involves medicines. There are 2 classes of medicine used for asthma treatment:   Controller medicines. These prevent asthma symptoms from occurring. They are usually taken every day.  Reliever or rescue medicines. These quickly relieve asthma symptoms. They are used as needed and provide short-term relief. Your health care provider will help you create an asthma action plan. An asthma action plan is a written plan for managing and treating your asthma attacks. It includes a list of your asthma triggers and how they may be avoided. It also includes information on when medicines should be taken and when their dosage should be changed. An action plan may also involve the use of a device called a peak flow meter. A peak flow meter measures how well the lungs are working. It helps you monitor your condition. HOME CARE INSTRUCTIONS   Take medicines only as directed by your health care provider. Speak with your health care provider if you have questions about how or when to take the medicines.  Use a peak flow meter as directed by your health care provider. Record and keep track of readings.  Understand and use the action plan to help minimize or stop an asthma attack without needing to seek medical care.  Control your home environment in the following ways to help prevent asthma attacks:  Do not smoke. Avoid being exposed to secondhand smoke.  Change your heating and air conditioning filter regularly.  Limit your use of fireplaces and wood stoves.  Get rid of pests (such as roaches   and mice) and their droppings.  Throw away plants if you see mold on them.  Clean your floors and dust regularly. Use unscented cleaning products.  Try to have someone else vacuum for you regularly. Stay out of rooms while they are  being vacuumed and for a short while afterward. If you vacuum, use a dust mask from a hardware store, a double-layered or microfilter vacuum cleaner bag, or a vacuum cleaner with a HEPA filter.  Replace carpet with wood, tile, or vinyl flooring. Carpet can trap dander and dust.  Use allergy-proof pillows, mattress covers, and box spring covers.  Wash bed sheets and blankets every week in hot water and dry them in a dryer.  Use blankets that are made of polyester or cotton.  Clean bathrooms and kitchens with bleach. If possible, have someone repaint the walls in these rooms with mold-resistant paint. Keep out of the rooms that are being cleaned and painted.  Wash hands frequently. SEEK MEDICAL CARE IF:   You have wheezing, shortness of breath, or a cough even if taking medicine to prevent attacks.  The colored mucus you cough up (sputum) is thicker than usual.  Your sputum changes from clear or white to yellow, green, gray, or bloody.  You have any problems that may be related to the medicines you are taking (such as a rash, itching, swelling, or trouble breathing).  You are using a reliever medicine more than 2-3 times per week.  Your peak flow is still at 50-79% of your personal best after following your action plan for 1 hour.  You have a fever. SEEK IMMEDIATE MEDICAL CARE IF:   You seem to be getting worse and are unresponsive to treatment during an asthma attack.  You are short of breath even at rest.  You get short of breath when doing very little physical activity.  You have difficulty eating, drinking, or talking due to asthma symptoms.  You develop chest pain.  You develop a fast heartbeat.  You have a bluish color to your lips or fingernails.  You are light-headed, dizzy, or faint.  Your peak flow is less than 50% of your personal best.   This information is not intended to replace advice given to you by your health care provider. Make sure you discuss any  questions you have with your health care provider.   Document Released: 01/05/2005 Document Revised: 09/26/2014 Document Reviewed: 08/04/2012 Elsevier Interactive Patient Education 2016 [employer]  

## 2015-02-08 ENCOUNTER — Other Ambulatory Visit: Payer: Self-pay | Admitting: Obstetrics and Gynecology

## 2015-02-08 DIAGNOSIS — Z1231 Encounter for screening mammogram for malignant neoplasm of breast: Secondary | ICD-10-CM

## 2015-02-14 ENCOUNTER — Ambulatory Visit: Payer: 59 | Attending: Obstetrics and Gynecology

## 2015-04-22 ENCOUNTER — Telehealth: Payer: Self-pay | Admitting: Gastroenterology

## 2015-04-22 ENCOUNTER — Other Ambulatory Visit: Payer: Self-pay | Admitting: Bariatrics

## 2015-04-22 DIAGNOSIS — R1319 Other dysphagia: Secondary | ICD-10-CM

## 2015-04-22 NOTE — Telephone Encounter (Signed)
Left voice message for patient to call and schedule appointment with Dr. 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 Notes under media

## 2015-04-23 ENCOUNTER — Ambulatory Visit: Payer: 59

## 2015-04-23 ENCOUNTER — Other Ambulatory Visit: Payer: Self-pay

## 2015-04-26 NOTE — Telephone Encounter (Signed)
ERROR

## 2015-05-07 ENCOUNTER — Ambulatory Visit: Payer: 59

## 2015-05-07 ENCOUNTER — Ambulatory Visit
Admission: RE | Admit: 2015-05-07 | Discharge: 2015-05-07 | Disposition: A | Discharge status: Home / Self Care | Payer: 59 | Source: Ambulatory Visit | Attending: Bariatrics | Admitting: Bariatrics

## 2015-05-07 ENCOUNTER — Other Ambulatory Visit: Payer: Self-pay | Admitting: Bariatrics

## 2015-05-07 DIAGNOSIS — Z9884 Bariatric surgery status: Secondary | ICD-10-CM | POA: Insufficient documentation

## 2015-05-07 DIAGNOSIS — R1319 Other dysphagia: Secondary | ICD-10-CM

## 2015-05-07 DIAGNOSIS — K219 Gastro-esophageal reflux disease without esophagitis: Secondary | ICD-10-CM | POA: Diagnosis not present

## 2015-05-15 ENCOUNTER — Encounter: Payer: Self-pay | Admitting: *Deleted

## 2015-05-24 ENCOUNTER — Ambulatory Visit
Admission: RE | Admit: 2015-05-24 | Discharge: 2015-05-24 | Disposition: A | Discharge status: Home / Self Care | Payer: 59 | Source: Ambulatory Visit | Attending: Gastroenterology | Admitting: Gastroenterology

## 2015-05-24 ENCOUNTER — Ambulatory Visit: Payer: 59 | Admitting: Student in an Organized Health Care Education/Training Program

## 2015-05-24 ENCOUNTER — Encounter
Admission: RE | Disposition: A | Discharge status: Home / Self Care | Payer: Self-pay | Source: Ambulatory Visit | Attending: Gastroenterology

## 2015-05-24 DIAGNOSIS — Z79899 Other long term (current) drug therapy: Secondary | ICD-10-CM | POA: Diagnosis not present

## 2015-05-24 DIAGNOSIS — G43909 Migraine, unspecified, not intractable, without status migrainosus: Secondary | ICD-10-CM | POA: Diagnosis not present

## 2015-05-24 DIAGNOSIS — M795 Residual foreign body in soft tissue: Secondary | ICD-10-CM | POA: Insufficient documentation

## 2015-05-24 DIAGNOSIS — Z9884 Bariatric surgery status: Secondary | ICD-10-CM | POA: Diagnosis not present

## 2015-05-24 DIAGNOSIS — E119 Type 2 diabetes mellitus without complications: Secondary | ICD-10-CM | POA: Diagnosis not present

## 2015-05-24 DIAGNOSIS — Z8719 Personal history of other diseases of the digestive system: Secondary | ICD-10-CM | POA: Insufficient documentation

## 2015-05-24 DIAGNOSIS — Z7951 Long term (current) use of inhaled steroids: Secondary | ICD-10-CM | POA: Diagnosis not present

## 2015-05-24 DIAGNOSIS — R42 Dizziness and giddiness: Secondary | ICD-10-CM | POA: Diagnosis not present

## 2015-05-24 DIAGNOSIS — G35 Multiple sclerosis: Secondary | ICD-10-CM | POA: Diagnosis not present

## 2015-05-24 DIAGNOSIS — K219 Gastro-esophageal reflux disease without esophagitis: Secondary | ICD-10-CM | POA: Insufficient documentation

## 2015-05-24 DIAGNOSIS — M19012 Primary osteoarthritis, left shoulder: Secondary | ICD-10-CM | POA: Insufficient documentation

## 2015-05-24 DIAGNOSIS — Z9049 Acquired absence of other specified parts of digestive tract: Secondary | ICD-10-CM | POA: Diagnosis not present

## 2015-05-24 DIAGNOSIS — G4730 Sleep apnea, unspecified: Secondary | ICD-10-CM | POA: Diagnosis not present

## 2015-05-24 DIAGNOSIS — T182XXA Foreign body in stomach, initial encounter: Secondary | ICD-10-CM | POA: Insufficient documentation

## 2015-05-24 DIAGNOSIS — M19011 Primary osteoarthritis, right shoulder: Secondary | ICD-10-CM | POA: Diagnosis not present

## 2015-05-24 DIAGNOSIS — J45909 Unspecified asthma, uncomplicated: Secondary | ICD-10-CM | POA: Insufficient documentation

## 2015-05-24 DIAGNOSIS — G2581 Restless legs syndrome: Secondary | ICD-10-CM | POA: Diagnosis not present

## 2015-05-24 DIAGNOSIS — R1013 Epigastric pain: Secondary | ICD-10-CM | POA: Diagnosis not present

## 2015-05-24 DIAGNOSIS — Z7984 Long term (current) use of oral hypoglycemic drugs: Secondary | ICD-10-CM | POA: Insufficient documentation

## 2015-05-24 DIAGNOSIS — M170 Bilateral primary osteoarthritis of knee: Secondary | ICD-10-CM | POA: Diagnosis not present

## 2015-05-24 HISTORY — DX: Restless legs syndrome: G25.81

## 2015-05-24 HISTORY — DX: Unspecified mononeuropathy of bilateral lower limbs: G57.93

## 2015-05-24 HISTORY — DX: Dizziness and giddiness: R42

## 2015-05-24 HISTORY — DX: Inflammatory liver disease, unspecified: K75.9

## 2015-05-24 HISTORY — PX: ESOPHAGOGASTRODUODENOSCOPY (EGD) WITH PROPOFOL: SHX5813

## 2015-05-24 HISTORY — DX: Gastro-esophageal reflux disease without esophagitis: K21.9

## 2015-05-24 HISTORY — DX: Unspecified osteoarthritis, unspecified site: M19.90

## 2015-05-24 LAB — GLUCOSE, CAPILLARY
Glucose-Capillary: 158 mg/dL — ABNORMAL HIGH (ref 65–99)
Glucose-Capillary: 143 mg/dL — ABNORMAL HIGH (ref 65–99)

## 2015-05-24 SURGERY — ESOPHAGOGASTRODUODENOSCOPY (EGD) WITH PROPOFOL
Anesthesia: Monitor Anesthesia Care

## 2015-05-24 MED ORDER — GLYCOPYRROLATE 0.2 MG/ML IJ SOLN
INTRAMUSCULAR | Status: DC | PRN
Start: 2015-05-24 — End: 2015-05-24
  Administered 2015-05-24: 0.2 mg via INTRAVENOUS

## 2015-05-24 MED ORDER — LACTATED RINGERS IV SOLN
INTRAVENOUS | Status: DC
  Administered 2015-05-24 (×2): via INTRAVENOUS

## 2015-05-24 MED ORDER — LIDOCAINE HCL (CARDIAC) 20 MG/ML IV SOLN
INTRAVENOUS | Status: DC | PRN
  Administered 2015-05-24: 20 mg via INTRAVENOUS

## 2015-05-24 MED ORDER — PROPOFOL 10 MG/ML IV BOLUS
INTRAVENOUS | Status: DC | PRN
  Administered 2015-05-24: 100 mg via INTRAVENOUS

## 2015-05-24 SURGICAL SUPPLY — 31 items
BALLN DILATOR 10-12 8 (BALLOONS)
BALLN DILATOR 12-15 8 (BALLOONS)
BALLN DILATOR 15-18 8 (BALLOONS)
BALLN DILATOR CRE 0-12 8 (BALLOONS)
BALLN DILATOR ESOPH 8 10 CRE (MISCELLANEOUS) IMPLANT
BALLOON DILATOR 12-15 8 (BALLOONS) IMPLANT
BALLOON DILATOR 15-18 8 (BALLOONS) IMPLANT
BALLOON DILATOR CRE 0-12 8 (BALLOONS) IMPLANT
BLOCK BITE 60FR ADLT L/F GRN (MISCELLANEOUS) ×2 IMPLANT
CANISTER SUCT 1200ML W/VALVE (MISCELLANEOUS) ×2 IMPLANT
CLIP HMST 235XBRD CATH ROT (MISCELLANEOUS) IMPLANT
CLIP RESOLUTION 360 11X235 (MISCELLANEOUS)
FCP ESCP3.2XJMB 240X2.8X (MISCELLANEOUS)
FORCEPS BIOP RAD 4 LRG CAP 4 (CUTTING FORCEPS) IMPLANT
FORCEPS BIOP RJ4 240 W/NDL (MISCELLANEOUS)
FORCEPS ESCP3.2XJMB 240X2.8X (MISCELLANEOUS) IMPLANT
GOWN CVR UNV OPN BCK APRN NK (MISCELLANEOUS) ×2 IMPLANT
GOWN ISOL THUMB LOOP REG UNIV (MISCELLANEOUS) ×2
INJECTOR VARIJECT VIN23 (MISCELLANEOUS) IMPLANT
KIT DEFENDO VALVE AND CONN (KITS) IMPLANT
KIT ENDO PROCEDURE OLY (KITS) ×2 IMPLANT
MARKER SPOT ENDO TATTOO 5ML (MISCELLANEOUS) IMPLANT
PAD GROUND ADULT SPLIT (MISCELLANEOUS) IMPLANT
SNARE SHORT THROW 13M SML OVAL (MISCELLANEOUS) IMPLANT
SNARE SHORT THROW 30M LRG OVAL (MISCELLANEOUS) IMPLANT
SPOT EX ENDOSCOPIC TATTOO (MISCELLANEOUS)
SYR INFLATION 60ML (SYRINGE) IMPLANT
TRAP ETRAP POLY (MISCELLANEOUS) IMPLANT
VARIJECT INJECTOR VIN23 (MISCELLANEOUS)
WATER STERILE IRR 250ML POUR (IV SOLUTION) ×2 IMPLANT
WIRE CRE 18-20MM 8CM F G (MISCELLANEOUS) IMPLANT

## 2015-05-24 NOTE — Anesthesia Postprocedure Evaluation (Signed)
Anesthesia Post Note  Patient: Carrie Ashley  Procedure(s) Performed: Procedure(s) (LRB): ESOPHAGOGASTRODUODENOSCOPY (EGD) WITH PROPOFOL with possible dilation (N/A)  Patient location during evaluation: PACU Anesthesia Type: MAC Level of consciousness: awake and alert and oriented Pain management: pain level controlled Vital Signs Assessment: post-procedure vital signs reviewed and stable Respiratory status: spontaneous breathing and nonlabored ventilation Cardiovascular status: stable Postop Assessment: no signs of nausea or vomiting and adequate PO intake Anesthetic complications: no    Harolyn Rutherford

## 2015-05-24 NOTE — Transfer of Care (Signed)
Immediate Anesthesia Transfer of Care Note  Patient: Carrie Ashley  Procedure(s) Performed: Procedure(s) with comments: ESOPHAGOGASTRODUODENOSCOPY (EGD) WITH PROPOFOL with possible dilation (N/A) - diabetic - oral meds CPAP  Patient Location: PACU  Anesthesia Type: MAC  Level of Consciousness: awake, alert  and patient cooperative  Airway and Oxygen Therapy: Patient Spontanous Breathing and Patient connected to supplemental oxygen  Post-op Assessment: Post-op Vital signs reviewed, Patient's Cardiovascular Status Stable, Respiratory Function Stable, Patent Airway and No signs of Nausea or vomiting  Post-op Vital Signs: Reviewed and stable  Complications: No apparent anesthesia complications

## 2015-05-24 NOTE — Op Note (Signed)
[facility]astro[facility] Patient Name: Carrie Ashley Procedure Date: 05/24/2015 8:35 AM MRN: [id number] Account #: [id number] Date of Birth: 10/11/1950 Admit Type: Outpatient Age: 65 Room: [facility] OR ROOM 01 Gender: Female Note Status: Finalized Procedure:            Upper GI endoscopy Indications:          Epigastric abdominal pain Providers:            Midge Minium, MD Referring MD:         Marya Amsler. Dareen Piano, MD (Referring MD) Medicines:            Propofol per Anesthesia Complications:        No immediate complications. Procedure:            Pre-Anesthesia Assessment:                       - Prior to the procedure, a History and Physical was                        performed, and patient medications and allergies were                        reviewed. The patient's tolerance of previous                        anesthesia was also reviewed. The risks and benefits of                        the procedure and the sedation options and risks were                        discussed with the patient. All questions were                        answered, and informed consent was obtained. Prior                        Anticoagulants: The patient has taken no previous                        anticoagulant or antiplatelet agents. ASA Grade                        Assessment: II - A patient with mild systemic disease.                        After reviewing the risks and benefits, the patient was                        deemed in satisfactory condition to undergo the                        procedure.                       After obtaining informed consent, the endoscope was                        passed under direct vision. Throughout the procedure,  the patient's blood pressure, pulse, and oxygen                        saturations were monitored continuously. The Olympus                        [id number]-HQ190 Endoscope (S#. [id number]) was introduced      through the mouth, and advanced to the jejunum. The                        upper GI endoscopy was accomplished without difficulty.                        The patient tolerated the procedure well. Findings:      Evidence of a gastric bypass was found. A gastric pouch was found. The       staple line appeared intact. The gastrojejunal anastomosis was       characterized by healthy appearing mucosa and an intact staple line.       This was traversed. The pouch-to-jejunum limb was characterized by       healthy appearing mucosa.      A staple was found at the anastomosis. Removal was accomplished with a       large-capacity forceps. Impression:           - Gastric bypass with intact staple line. Gastrojejunal                        anastomosis characterized by healthy appearing mucosa                        and an intact staple line.                       - A staple was found in the stomach. Removal was                        successful. Recommendation:       - Continue present medications. Procedure Code(s):    --- Professional ---                       [phone], Esophagogastroduodenoscopy, flexible, transoral;                        with removal of foreign body(s) Diagnosis Code(s):    --- Professional ---                       R10.13, Epigastric pain                       Z98.84, Bariatric surgery status                       T18.2XXA, Foreign body in stomach, initial encounter CPT copyright 2016 American Medical Association. All rights reserved. The codes documented in this report are preliminary and upon coder review may  be revised to meet current compliance requirements. Midge Minium, MD 05/24/2015 9:00:38 AM This report has been signed electronically. Number of Addenda: 0 Note Initiated On: 05/24/2015 8:35 AM Total Procedure Duration: 0 hours 3 minutes 32 seconds       [facility]

## 2015-05-24 NOTE — H&P (Signed)
[facility]  [address] [place], [place] [address] Phone: [phone] Fax : [phone]  Primary Care Physician:  Lauro Regulus., MD Primary Gastroenterologist:  Dr. Servando Snare  Pre-Procedure History & Physical: HPI:  Carrie Ashley is a 65 y.o. female is here for an endoscopy.   Past Medical History  Diagnosis Date  . MS (multiple sclerosis) (HCC)   . Diabetes mellitus without complication (HCC)   . Asthma   . Sleep apnea     CPAP  . Arthritis     shoulders, knees  . Restless leg syndrome   . 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 Migraines     approx 1x/mo  . 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 Hepatitis     30 yrs ago - no symptoms. tests positive  . GERD (gastroesophageal reflux disease)     Past Surgical History  Procedure Laterality Date  . Sinus exploration    . Knee arthroscopy    . Appendectomy    . Cholecystectomy    . 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 Alva Garnet    Prior to Admission medications   Medication Sig Start Date End Date Taking? Authorizing Provider  albuterol (PROVENTIL HFA;VENTOLIN HFA) 108 (90 Base) MCG/ACT inhaler Inhale into the lungs every 6 (six) hours as needed for wheezing or shortness of breath.   Yes Historical Provider, MD  BusPIRone HCl (BUSPAR PO) Take by mouth 2 (two) times daily.   Yes Historical Provider, MD  Calcium Citrate-Vitamin D (CALCIUM + D PO) Take by mouth 2 (two) times daily.   Yes Historical Provider, MD  Escitalopram Oxalate (LEXAPRO PO) Take by mouth at bedtime.   Yes Historical Provider, MD  esomeprazole (NEXIUM) 40 MG capsule Take 40 mg by mouth daily at 12 noon.   Yes Historical Provider, MD  Fluticasone-Salmeterol (ADVAIR DISKUS IN) Inhale into the lungs 2 (two) times daily.   Yes Historical Provider, MD  gabapentin (NEURONTIN) 600 MG tablet Take 600 mg by mouth 2 (two) times daily.   Yes Historical Provider, MD  metFORMIN (GLUCOPHAGE) 500 MG  tablet Take by mouth 2 (two) times daily with a meal.   Yes Historical Provider, MD  Multiple Vitamins-Minerals (BARIATRIC FUSION PO) Take by mouth daily.   Yes Historical Provider, MD  Pramipexole Dihydrochloride (MIRAPEX PO) Take by mouth at bedtime.   Yes Historical Provider, MD  propranolol (INDERAL) 40 MG tablet Take 40 mg by mouth daily.   Yes Historical Provider, MD  SUMATRIPTAN SUCCINATE PO Take by mouth as needed.   Yes Historical Provider, MD    Allergies as of 04/23/2015 - Review Complete 01/23/2015  Allergen Reaction Noted  . Byetta 10 mcg pen [exenatide] Hives 01/14/2015  . Morphine and related Nausea And Vomiting 01/14/2015  . Niacin and related Hives 01/14/2015    History reviewed. No pertinent family history.  Social History   Social History  . Marital Status: Married    Spouse Name: N/A  . Number of Children: N/A  . Years of Education: N/A   Occupational History  . Not on file.   Social History Main Topics  . Smoking status: Never Smoker   . Smokeless tobacco: Not on file  . Alcohol Use: No  . Drug Use: No  . Sexual Activity: Not on file   Other Topics Concern  . Not on file   Social History Narrative  Review of Systems: See HPI, otherwise negative ROS  Physical Exam: BP 139/64 mmHg  Pulse 71  Temp(Src) 97.5 F (36.4 C)  Resp 16  Ht  (1.549 m)  Wt 184 lb (83.462 kg)  BMI 34.78 kg/m2  SpO2 98% General:   Alert,  pleasant and cooperative in NAD Head:  Normocephalic and atraumatic. Neck:  Supple; no masses or thyromegaly. Lungs:  Clear throughout to auscultation.    Heart:  Regular rate and rhythm. Abdomen:  Soft, nontender and nondistended. Normal bowel sounds, without guarding, and without rebound.   Neurologic:  Alert and  oriented x4;  grossly normal neurologically.  Impression/Plan: Carrie Ashley is here for an endoscopy to be performed for possible anastomotic ulcer  Risks, benefits, limitations, and alternatives regarding   endoscopy have been reviewed with the patient.  Questions have been answered.  All parties agreeable.   Midge Minium, MD  05/24/2015, 8:12 AM

## 2015-05-24 NOTE — Anesthesia Preprocedure Evaluation (Signed)
Anesthesia Evaluation  Patient identified by MRN, date of birth, ID band Patient awake    Reviewed: Allergy & Precautions, NPO status , Patient's Chart, lab work & pertinent test results  Airway Mallampati: II  TM Distance: >3 FB Neck ROM: Full    Dental no notable dental hx.    Pulmonary asthma , sleep apnea and Continuous Positive Airway Pressure Ventilation ,    Pulmonary exam normal        Cardiovascular negative cardio ROS Normal cardiovascular exam     Neuro/Psych  Headaches,  Neuromuscular disease (Ms) negative psych ROS   GI/Hepatic Neg liver ROS, GERD  Medicated and Controlled,  Endo/Other  diabetes, Type 2, Oral Hypoglycemic Agents  Renal/GU negative Renal ROS     Musculoskeletal  (+) Arthritis , Osteoarthritis,    Abdominal   Peds  Hematology negative hematology ROS (+)   Anesthesia Other Findings   Reproductive/Obstetrics                             Anesthesia Physical Anesthesia Plan  ASA: III  Anesthesia Plan: MAC   Post-op Pain Management:    Induction: Intravenous  Airway Management Planned:   Additional Equipment:   Intra-op Plan:   Post-operative Plan:   Informed Consent: I have reviewed the patients History and Physical, chart, labs and discussed the procedure including the risks, benefits and alternatives for the proposed anesthesia with the patient or authorized representative who has indicated his/her understanding and acceptance.     Plan Discussed with: CRNA  Anesthesia Plan Comments:         Anesthesia Quick Evaluation

## 2015-05-24 NOTE — Anesthesia Procedure Notes (Signed)
Procedure Name: MAC Performed by: Amer Alcindor Pre-anesthesia Checklist: Patient identified, Emergency Drugs available, Suction available, Patient being monitored and Timeout performed Patient Re-evaluated:Patient Re-evaluated prior to inductionOxygen Delivery Method: Nasal cannula       

## 2015-05-27 ENCOUNTER — Encounter: Payer: Self-pay | Admitting: Gastroenterology

## 2015-11-15 ENCOUNTER — Inpatient Hospital Stay
Admit: 2015-11-15 | Discharge: 2015-11-15 | Disposition: A | Discharge status: Home / Self Care | Attending: Emergency Medicine

## 2015-11-15 DIAGNOSIS — K219 Gastro-esophageal reflux disease without esophagitis: Secondary | ICD-10-CM

## 2015-11-15 MED ORDER — FAMOTIDINE 20 MG PO TABS
20 MG | Freq: Once | ORAL | Status: DC
Start: 2015-11-15 — End: 2015-11-15

## 2015-11-15 MED ORDER — GI COCKTAIL
Freq: Once | Status: AC
Start: 2015-11-15 — End: 2015-11-15
  Administered 2015-11-15: 08:00:00 30 mL via ORAL

## 2015-11-15 MED FILL — GI COCKTAIL: Qty: 30

## 2015-11-15 NOTE — ED Notes (Signed)
Pt notes relief s/p GI cocktail.     Evalina FieldJill R Macenzie Burford, RN  11/15/15 [phone]

## 2015-11-15 NOTE — ED Provider Notes (Signed)
Triage Chief Complaint:   Cough (Pt presents with cough that started at 0200 that woke her from her sleep.  Notes clear phlegm.  Denies fever.  +nausea during coughing episode, but no coughing at this time.  Notes hx of Gastric Reflux.  Also notes that she had a flu shot on Wednessday.)    HOPI:  Vicki Hurst is a 65 y.o. female that presents after an episode of reflux.  She was sleeping and was awakened around 2 AM.  She had acid reflux back up into her throat and mouth.  This caused her to have a coughing episode and was choked.  She stick about 15 minutes to actually catch her breath.  She copious amounts of clear mucus produced.  No chest pain.  No shortness of breath.  No pain with inspiration.  No high fever.  She is getting back to normal now.  She has had episodes like this in the past.  She notices she eats late at night.  She has a problem with reflux.  She has history of breast cancer.    ROS:  General:  No fevers, no chills, no weakness  Eyes:  No recent vison changes, no discharge  ENT:  No sore throat, no nasal congestion, no hearing changes  Cardiovascular:  No chest pain, no palpitations  Respiratory:  No shortness of breath, cough, no wheezing  Gastrointestinal:  Throat and esophageal pain, no nausea, no vomiting, no diarrhea  Musculoskeletal:  No muscle pain, no joint pain  Skin:  No rash, no pruritis, no easy bruising  Neurologic:  No speech problems, no headache, no extremity numbness, no extremity tingling, no extremity weakness  Psychiatric:  No anxiety  Genitourinary:  No dysuria, no hematuria  Endocrine:  No unexpected weight gain, no unexpected weight loss  Extremities:  no edema, no pain    Past Medical History:   Diagnosis Date   ??? Cancer (HCC)     Breast   ??? Gastric reflux      Past Surgical History:   Procedure Laterality Date   ??? TUNNELED VENOUS PORT PLACEMENT       History reviewed. No pertinent family history.  Social History     Social History   ??? Marital status: Married      Spouse name: N/A   ??? Number of children: N/A   ??? Years of education: N/A     Occupational History   ??? Not on file.     Social History Main Topics   ??? Smoking status: Never Smoker   ??? Smokeless tobacco: Never Used   ??? Alcohol use No   ??? Drug use: No   ??? Sexual activity: Not on file     Other Topics Concern   ??? Not on file     Social History Narrative   ??? No narrative on file     No current facility-administered medications for this encounter.      No current outpatient prescriptions on file.     Allergies   Allergen Reactions   ??? Sulfa Antibiotics Swelling       Nursing Notes Reviewed    Physical Exam:  ED Triage Vitals [11/15/15 0323]   Enc Vitals Group      BP (!) 191/97      Pulse 82      Resp (!) 96      Temp 98.2 ??F (36.8 ??C)      Temp Source Oral  SpO2 96 %      Weight 164 lb (74.4 kg)      Height 5\' 8"  (1.727 m)      Head Circumference       Peak Flow       Pain Score       Pain Loc       Pain Edu?       Excl. in GC?      GENERAL APPEARANCE: Awake and alert. Cooperative. No acute distress. She is sitting up for examination.  HEAD: Normocephalic. Atraumatic.  EYES: EOM's grossly intact. Sclera anicteric. PEERL  ENT: Mucous membranes are moist. Tolerates saliva. No trismus.  NECK: Supple. No meningismus. Trachea midline.  No JVD.  HEART: RRR. Radial pulses 2+.  Heart without murmur.  LUNGS: Respirations unlabored.  Lungs are clear.  ABDOMEN: Soft. Non-tender. No guarding or rebound.  No CVAT.  EXTREMITIES: No acute deformities.  No calf pain or edema.  SKIN: Warm and dry.  NEUROLOGICAL: No gross facial drooping. Moves all 4 extremities spontaneously.  PSYCHIATRIC: Normal mood.    I have reviewed and interpreted all of the currently available lab results from this visit (if applicable):  No results found for this visit on 11/15/15.   Radiographs (if obtained):  []  The following radiograph was interpreted by myself in the absence of a radiologist:  []  Radiologist's Report Reviewed:      EKG (if obtained): (All  EKG's are interpreted by myself in the absence of a cardiologist)    MDM:  She is placed in a room and examined.  She is afebrile.  Vitals are normal.  She is essentially back to normal.  I offered her some medication which he declines this.  She is concerned about medication interfering with her breast cancer treatment.  I offered an EKG and chest x-ray but she declined these as well.  She states all she wants is something to help soothe her esophagus.  She is given a GI cocktail.  She tells me she is better.  I will discharge her home and have her follow with her PCP.  She is in stable condition on release.    Clinical Impression:  1. Gastroesophageal reflux disease without esophagitis      (Please note that portions of this note may have been completed with a voice recognition program. Efforts were made to edit the dictations but occasionally words are mis-transcribed.)    Pollie Meyer, MD        Elyn Aquas, MD  11/15/15 2137

## 2016-02-05 ENCOUNTER — Encounter

## 2016-02-05 ENCOUNTER — Inpatient Hospital Stay: Admit: 2016-02-05 | Attending: Family Medicine | Primary: Family

## 2016-02-05 DIAGNOSIS — J029 Acute pharyngitis, unspecified: Secondary | ICD-10-CM

## 2016-03-07 IMAGING — CT CT HEAD WITHOUT CONTRAST
2 series · 14 of 30 positions shown, 16 images · non-contrast
Comparison: None.

CLINICAL DATA: Chronic migraines with syncopal episode. Recent
fall.

EXAM:
CT HEAD WITHOUT CONTRAST
TECHNIQUE: Contiguous axial images were obtained from the base of the skull
through the vertex without intravenous contrast.

[Series 2: head wo · axial · 0.39mm/px · z∈[+224,+323]mm · 6 of 32 slices shown, 8 images]
[im 5/32  brain]
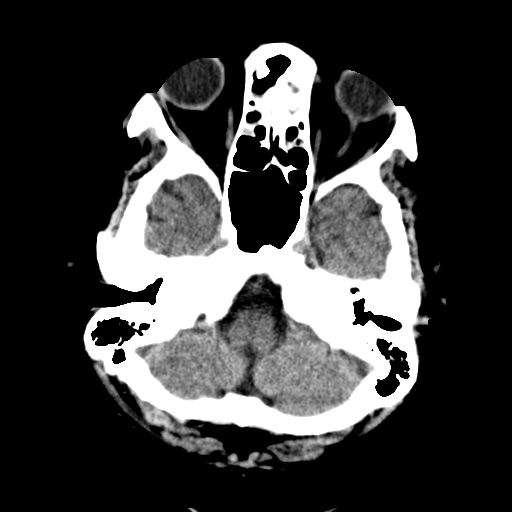
[im 5/32  bone]
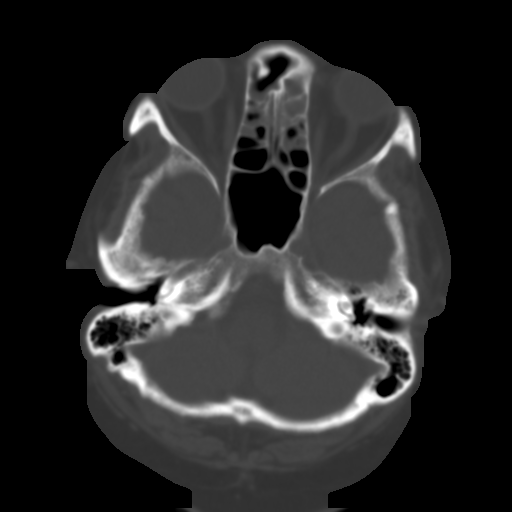
[im 9/32  brain]
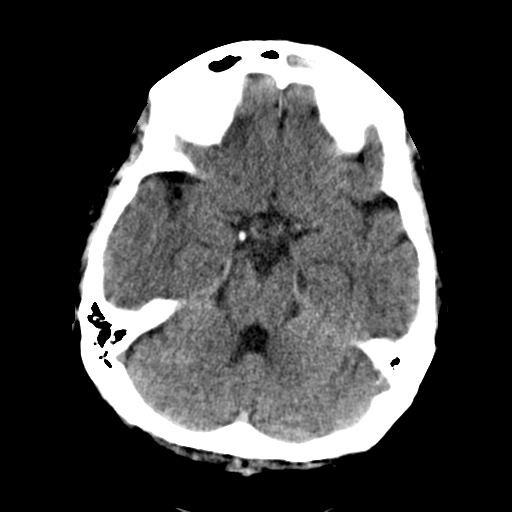
[im 14/32  brain]
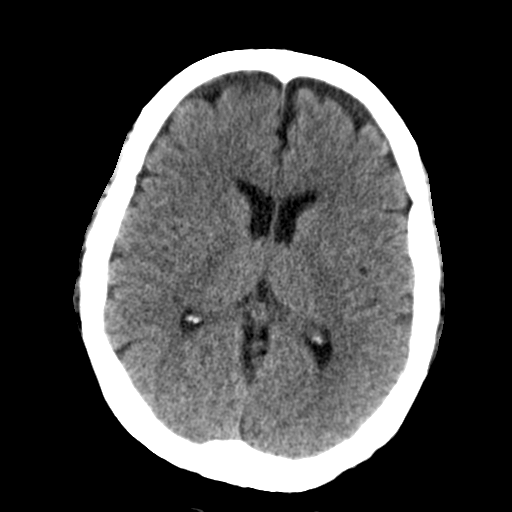
[im 18/32  brain]
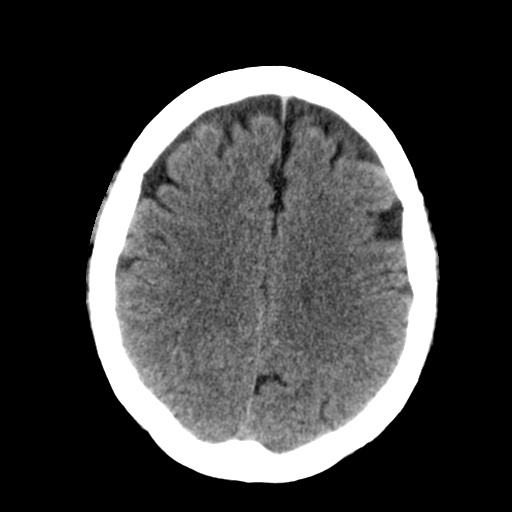
[im 23/32  brain]
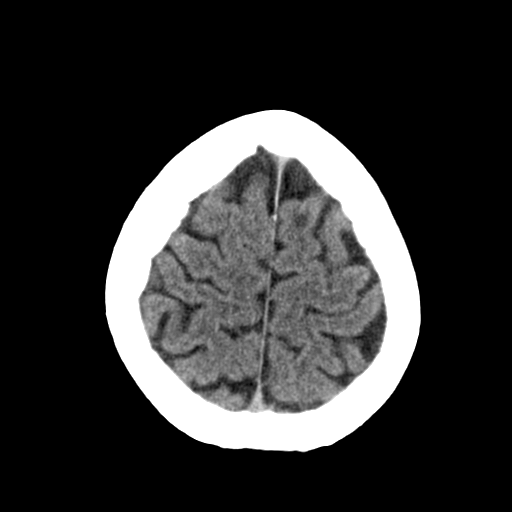
[im 23/32  bone]
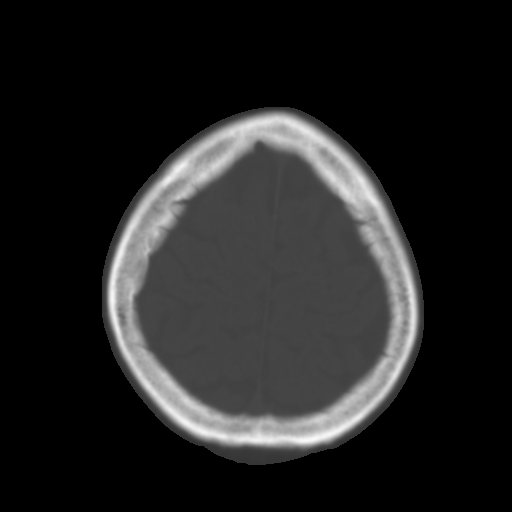
[im 27/32  brain]
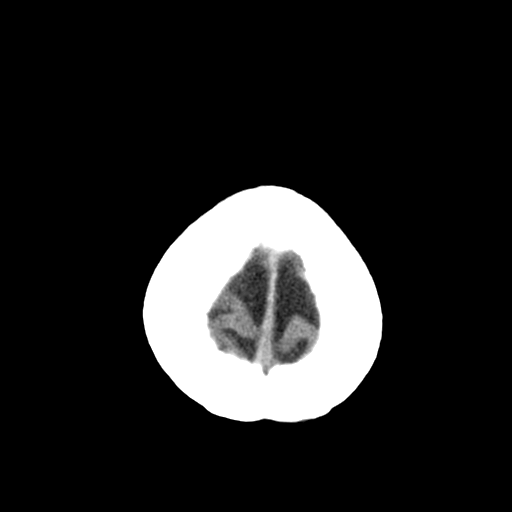

[Series 3: head bone · axial · 0.39mm/px · z∈[+218,+332]mm · 8 of 96 slices shown]
[im 10/96  bone]
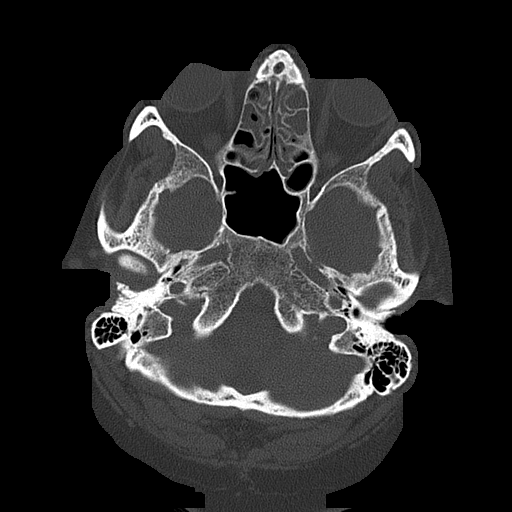
[im 19/96  bone]
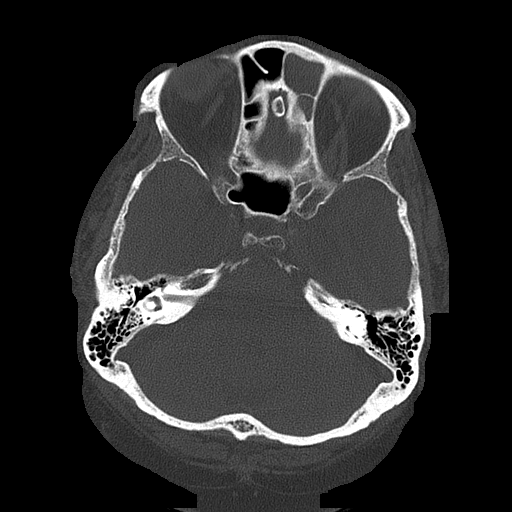
[im 32/96  bone]
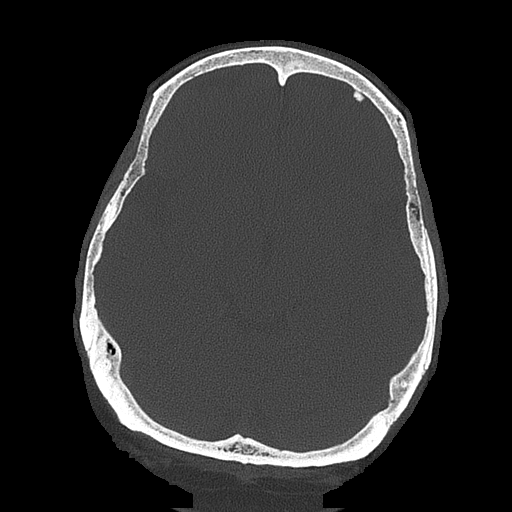
[im 41/96  bone]
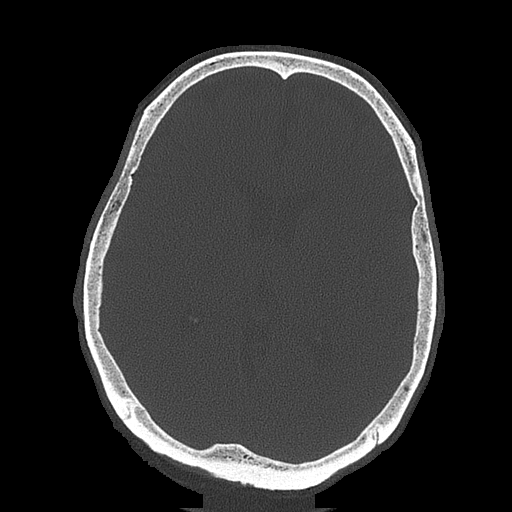
[im 55/96  bone]
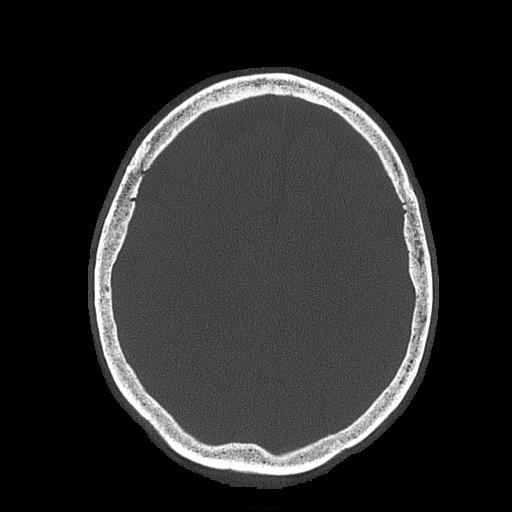
[im 64/96  bone]
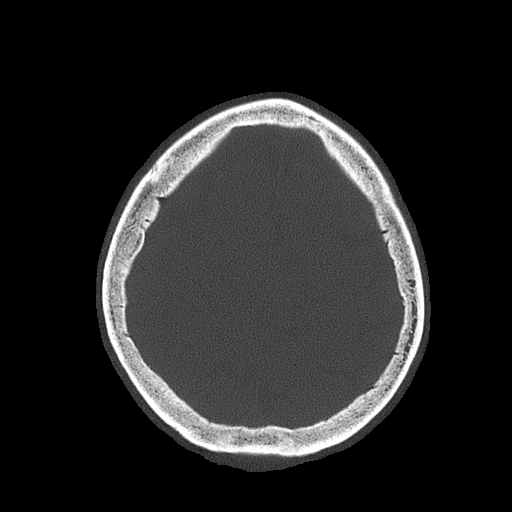
[im 77/96  bone]
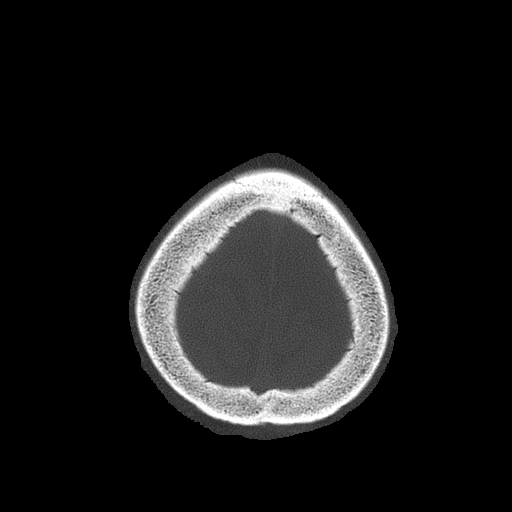
[im 86/96  bone]
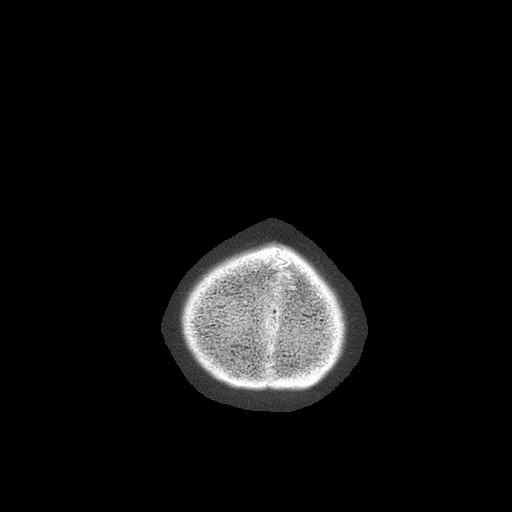

[14 of 30 positions shown; findings below may reference images not displayed]

FINDINGS: No evidence for acute infarction, hemorrhage, mass lesion,
hydrocephalus, or extra-axial fluid. Slight atrophy. Minor
hypoattenuation of white matter, possible chronic microvascular
ischemic change. Calvarium intact. Tiny osteoma projects from the
LEFT frontal inner table of skull. Moderate paranasal sinus disease
affecting the LEFT greater than RIGHT frontal, BILATERAL ethmoid,
and RIGHT greater than LEFT maxillary sinuses. Largely
unicompartmental sphenoid. No mastoid fluid.
IMPRESSION: No acute intracranial findings.

## 2016-10-28 ENCOUNTER — Inpatient Hospital Stay: Payer: PRIVATE HEALTH INSURANCE | Primary: Family

## 2016-10-28 ENCOUNTER — Encounter

## 2016-10-28 ENCOUNTER — Inpatient Hospital Stay: Admit: 2016-10-28 | Payer: PRIVATE HEALTH INSURANCE | Primary: Family

## 2016-10-28 DIAGNOSIS — R109 Unspecified abdominal pain: Secondary | ICD-10-CM

## 2016-11-11 IMAGING — CR DG CHEST 1V
1 series · 1 of 1 positions shown · non-contrast
Comparison: 11/16/2011

CLINICAL DATA: Cough

EXAM:
CHEST 1 VIEW

[portable]
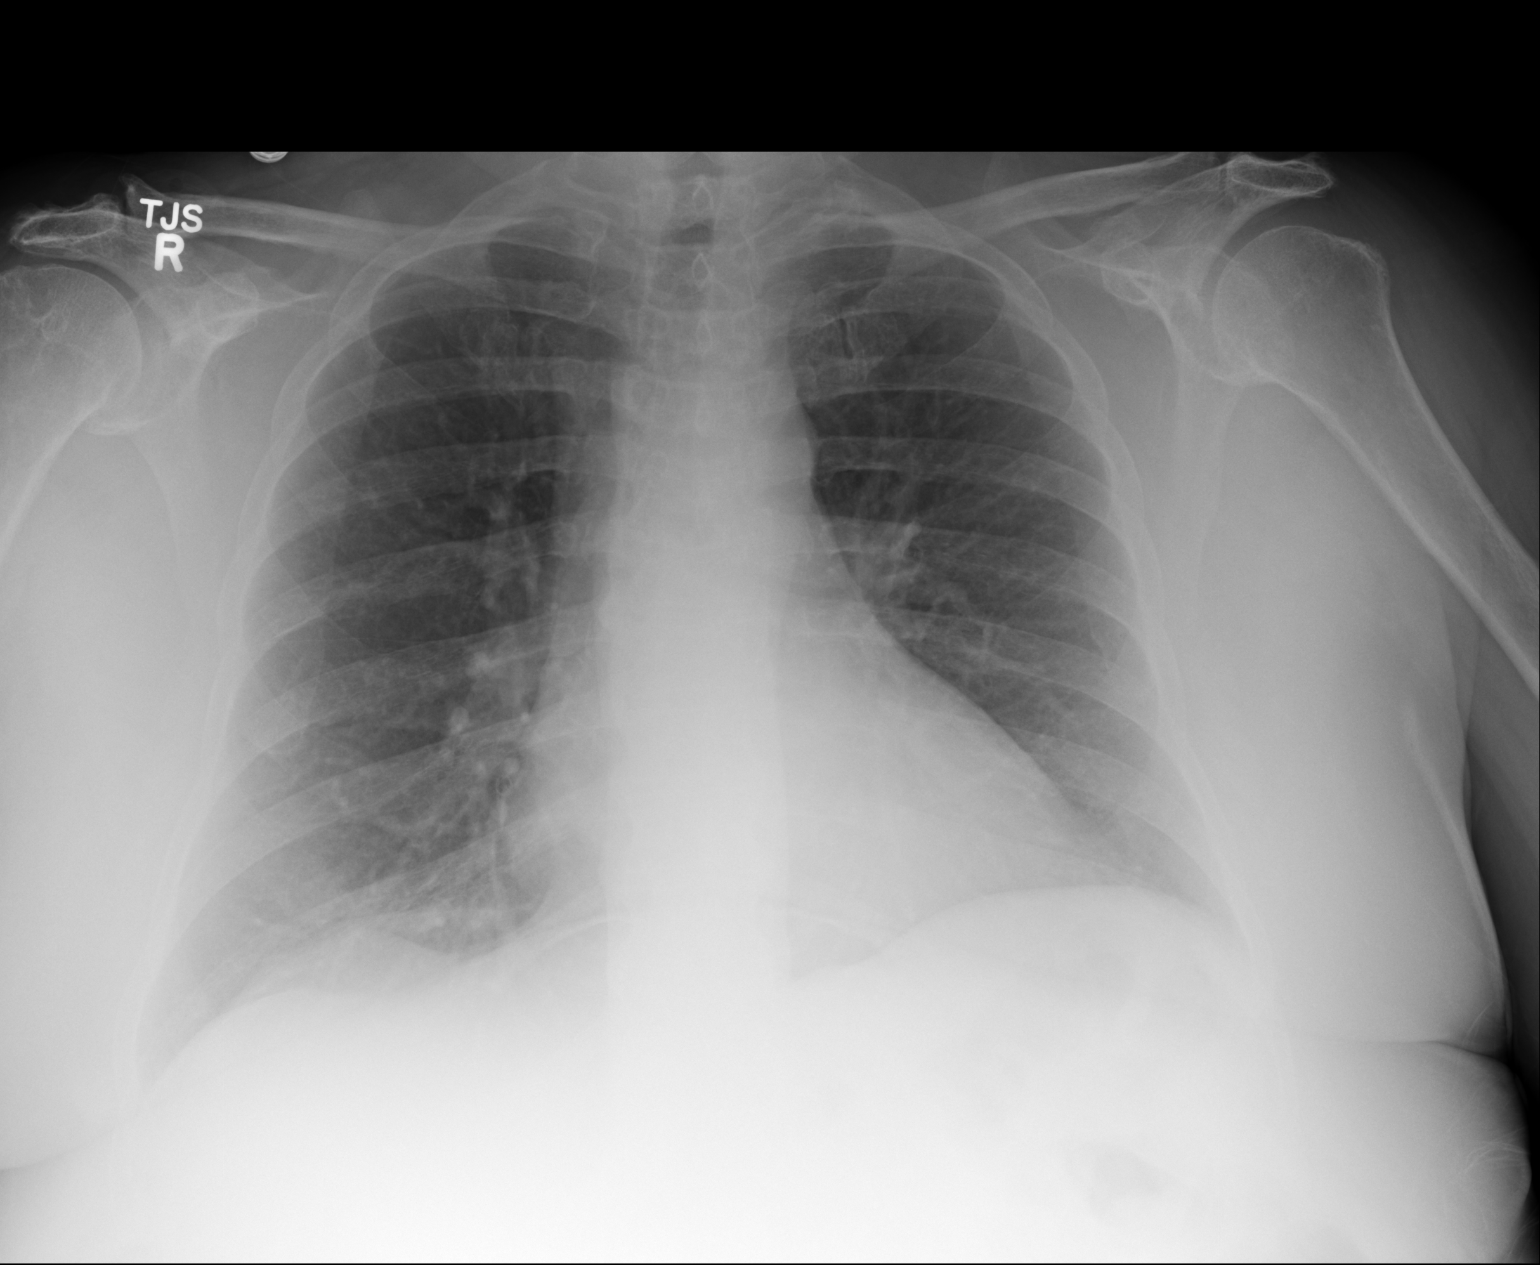

[1 of 1 positions shown; findings below may reference images not displayed]

FINDINGS: The heart size and mediastinal contours are within normal limits.
Both lungs are clear. The visualized skeletal structures are
unremarkable.
IMPRESSION: No active disease.

## 2016-11-20 IMAGING — CR DG CHEST 2V
1 series · 2 of 2 positions shown · non-contrast
Comparison: 01/14/2015

CLINICAL DATA: Shortness of breath

EXAM:
CHEST  2 VIEW

[Series 1: dg chest 2 view · 0.14mm/px · 2 of 2 slices shown]
[im 1/2]
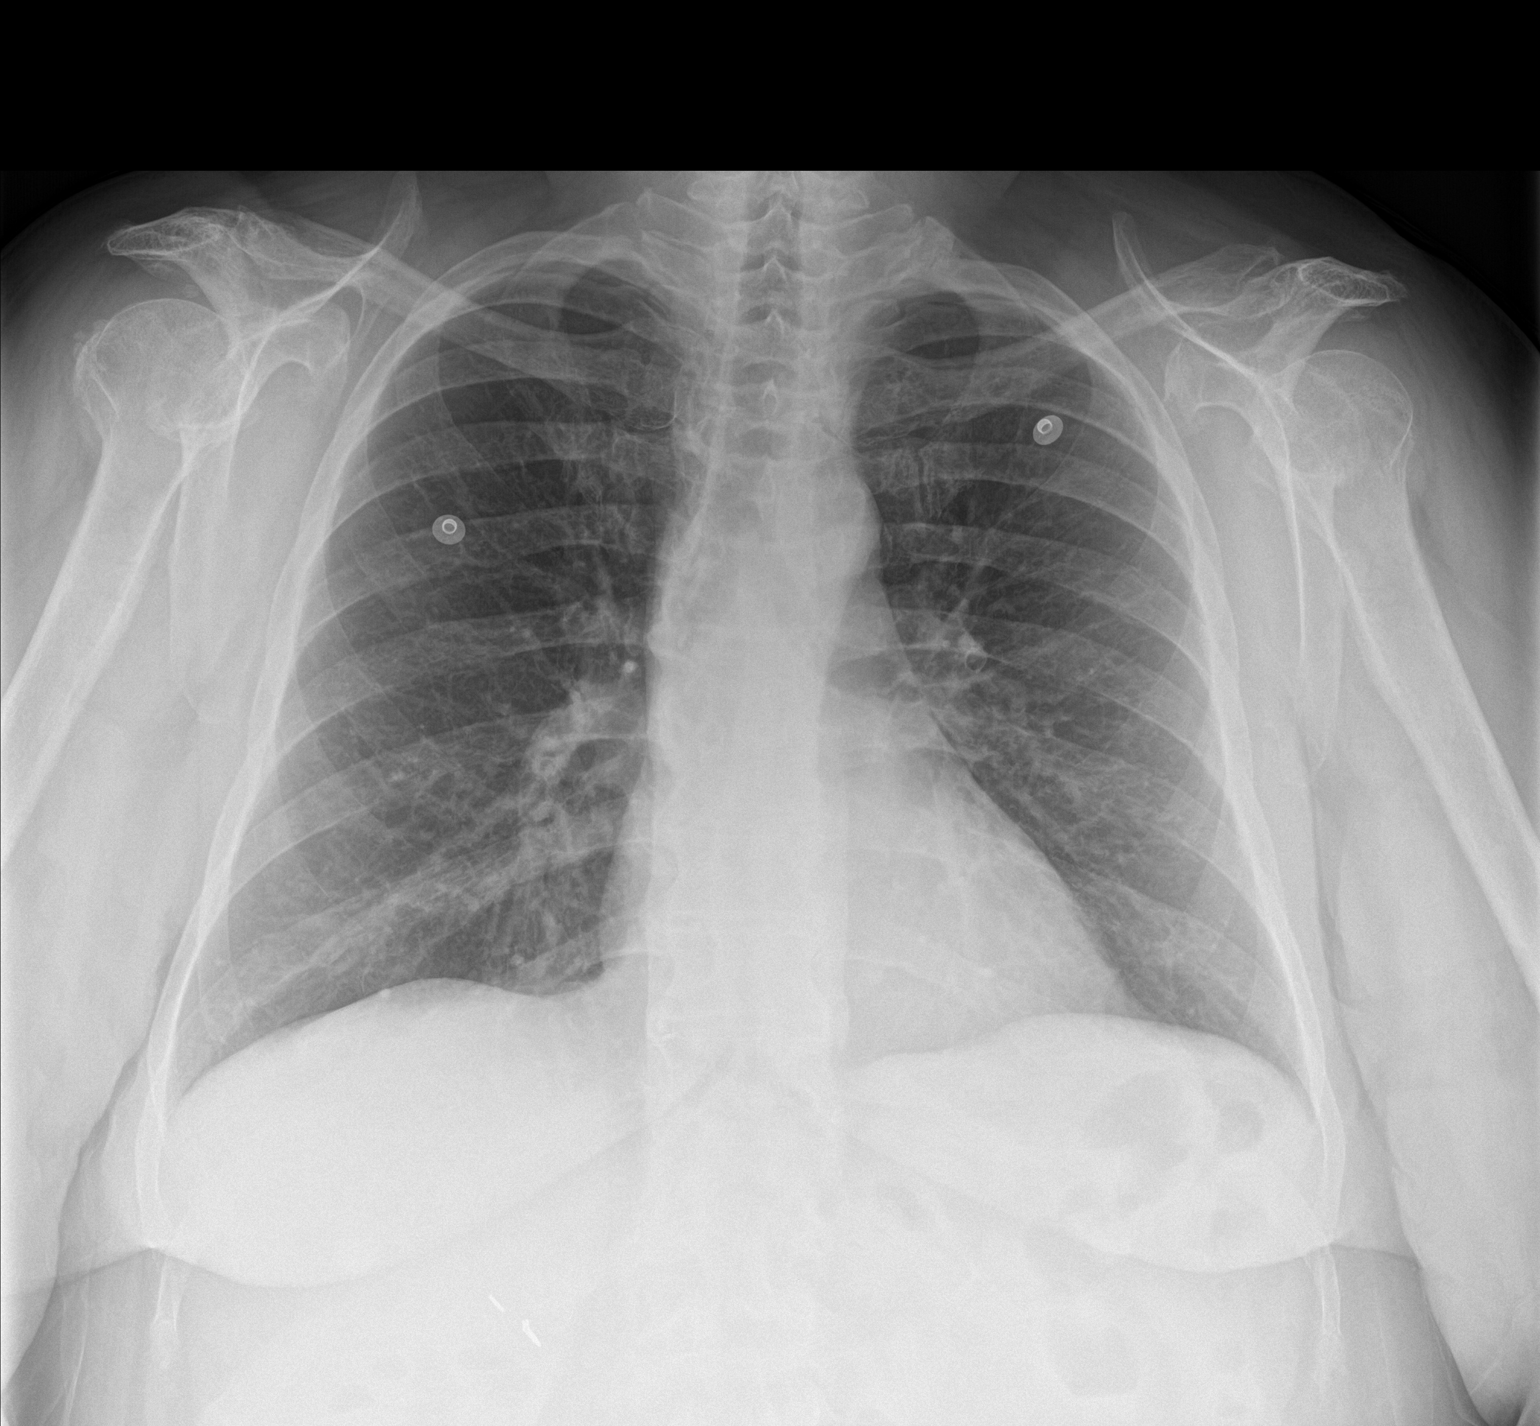
[im 2/2]
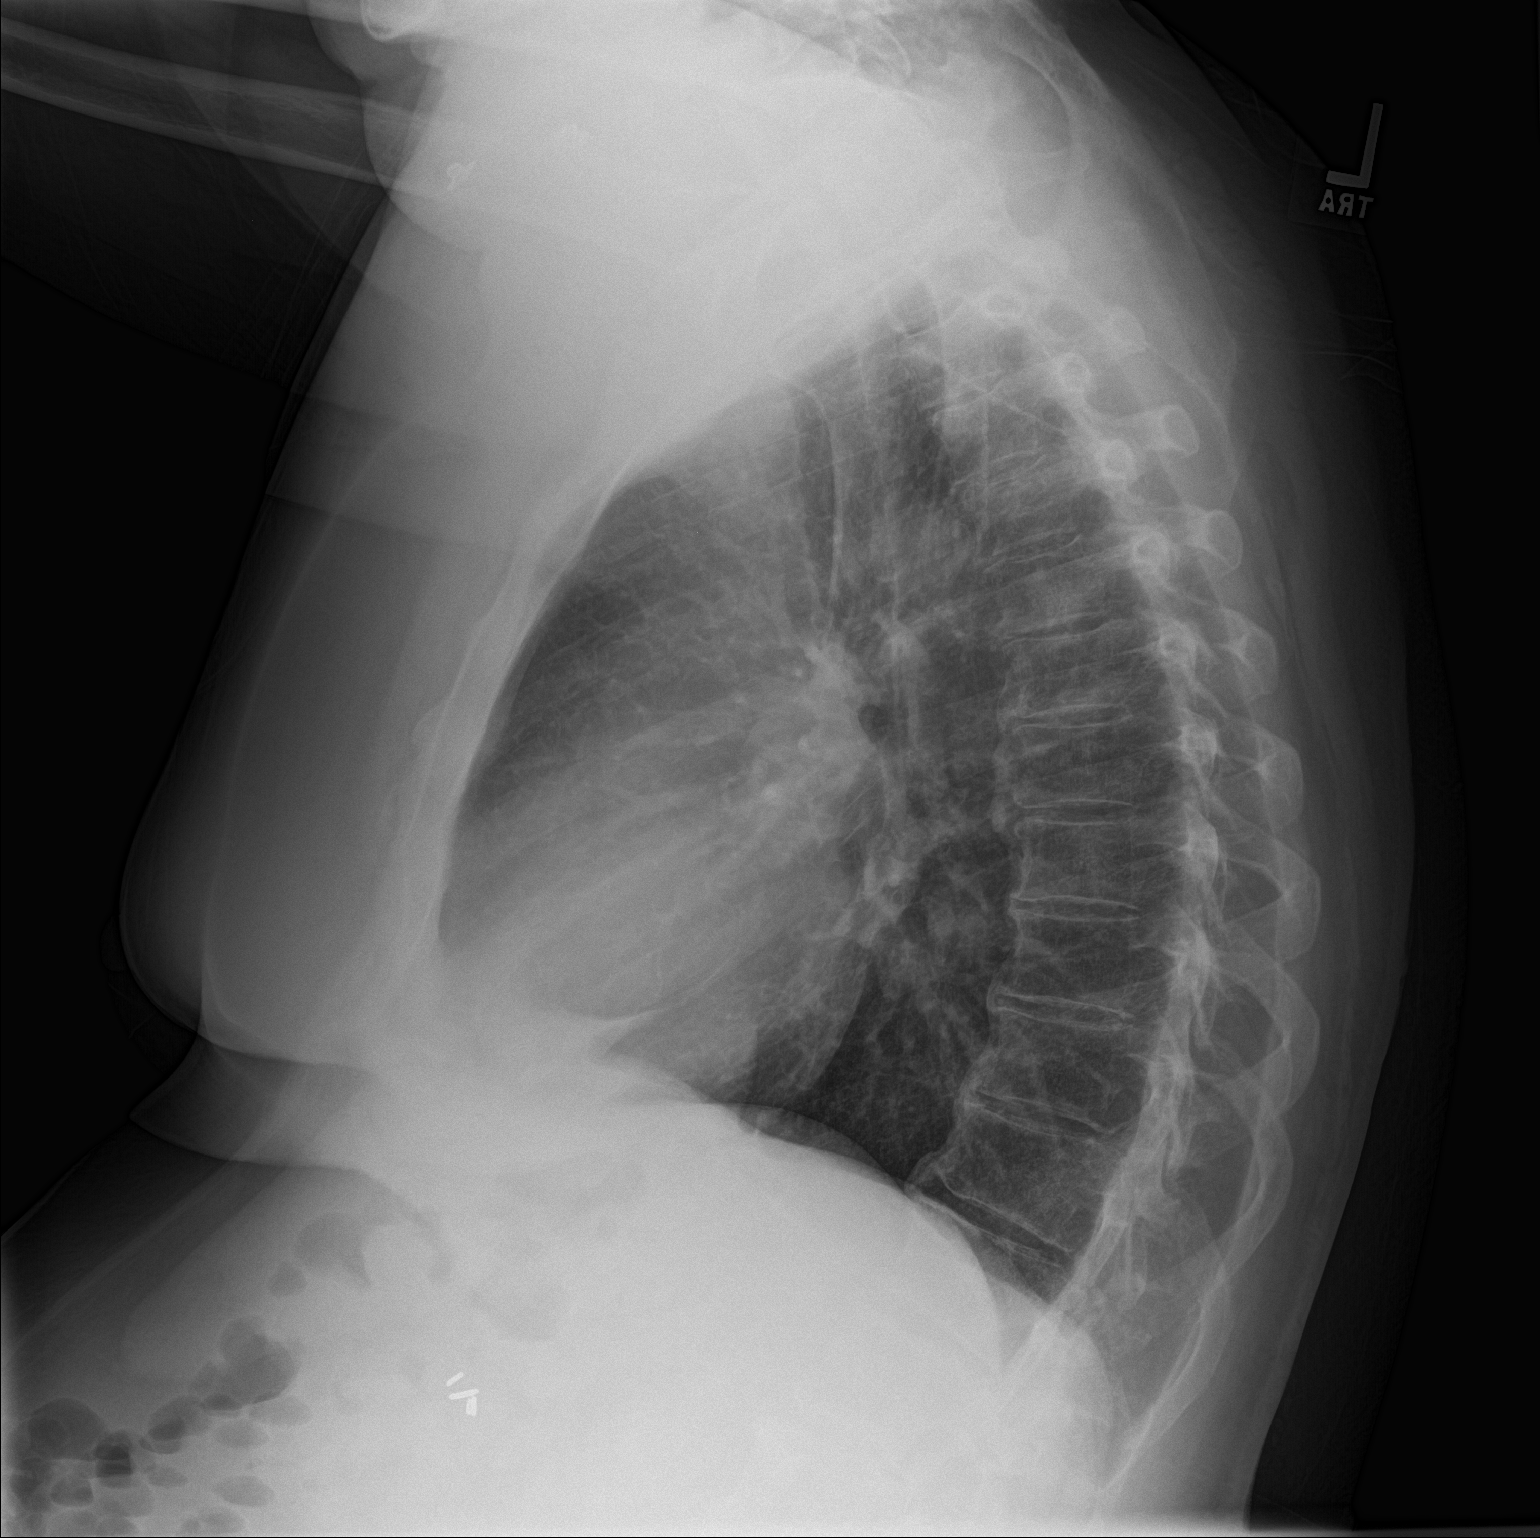

[2 of 2 positions shown; findings below may reference images not displayed]

FINDINGS: Normal heart size and mediastinal contours. No acute infiltrate or
edema. No effusion or pneumothorax. No acute osseous findings.
IMPRESSION: No active cardiopulmonary disease.

## 2017-02-15 ENCOUNTER — Inpatient Hospital Stay
Admit: 2017-02-15 | Discharge: 2017-02-15 | Disposition: A | Discharge status: Home / Self Care | Payer: MEDICARE | Attending: Emergency Medicine

## 2017-02-15 ENCOUNTER — Emergency Department: Admit: 2017-02-15 | Payer: MEDICARE | Primary: Family

## 2017-02-15 DIAGNOSIS — L03211 Cellulitis of face: Secondary | ICD-10-CM

## 2017-02-15 LAB — CBC WITH AUTO DIFFERENTIAL
Basophils %: 1 %
Basophils Absolute: 0.1 10*3/uL (ref 0.0–0.2)
Eosinophils %: 1.3 %
Eosinophils Absolute: 0.1 10*3/uL (ref 0.0–0.6)
Hematocrit: 39.9 % (ref 36.0–48.0)
Hemoglobin: 13.1 g/dL (ref 12.0–16.0)
Lymphocytes %: 15 %
Lymphocytes Absolute: 0.9 10*3/uL — ABNORMAL LOW (ref 1.0–5.1)
MCH: 28.3 pg (ref 26.0–34.0)
MCHC: 32.8 g/dL (ref 31.0–36.0)
MCV: 86.4 fL (ref 80.0–100.0)
MPV: 8.2 fL (ref 5.0–10.5)
Monocytes %: 10.5 %
Monocytes Absolute: 0.6 10*3/uL (ref 0.0–1.3)
Neutrophils %: 72.2 %
Neutrophils Absolute: 4.4 10*3/uL (ref 1.7–7.7)
Platelets: 199 10*3/uL (ref 135–450)
RBC: 4.61 M/uL (ref 4.00–5.20)
RDW: 13.6 % (ref 12.4–15.4)
WBC: 6.1 10*3/uL (ref 4.0–11.0)

## 2017-02-15 LAB — PROTIME-INR
INR: 0.97 (ref 0.86–1.14)
Protime: 11.1 s (ref 9.8–13.0)

## 2017-02-15 LAB — COMPREHENSIVE METABOLIC PANEL
ALT: 15 U/L (ref 10–40)
AST: 28 U/L (ref 15–37)
Albumin/Globulin Ratio: 1.3 (ref 1.1–2.2)
Albumin: 4.4 g/dL (ref 3.4–5.0)
Alkaline Phosphatase: 68 U/L (ref 40–129)
Anion Gap: 10 (ref 3–16)
BUN: 13 mg/dL (ref 7–20)
CO2: 26 mmol/L (ref 21–32)
Calcium: 10.1 mg/dL (ref 8.3–10.6)
Chloride: 104 mmol/L (ref 99–110)
Creatinine: 0.8 mg/dL (ref 0.6–1.2)
GFR African American: >60 (ref >60)
GFR Non-African American: >60 (ref >60)
Globulin: 3.3 g/dL
Glucose: 119 mg/dL — ABNORMAL HIGH (ref 70–99)
Potassium: 4.8 mmol/L (ref 3.5–5.1)
Sodium: 140 mmol/L (ref 136–145)
Total Bilirubin: 0.5 mg/dL (ref 0.0–1.0)
Total Protein: 7.7 g/dL (ref 6.4–8.2)

## 2017-02-15 LAB — POCT GLUCOSE: POC Glucose: 113 mg/dl — ABNORMAL HIGH (ref 70–99)

## 2017-02-15 LAB — LACTIC ACID: Lactic Acid: 1.2 mmol/L (ref 0.4–2.0)

## 2017-02-15 LAB — APTT: aPTT: 30.3 s (ref 26.0–36.0)

## 2017-02-15 MED ORDER — DEXAMETHASONE SODIUM PHOSPHATE 4 MG/ML IJ SOLN
4 MG/ML | Freq: Once | INTRAMUSCULAR | Status: AC
Start: 2017-02-15 — End: 2017-02-15
  Administered 2017-02-15: 17:00:00 8 mg via ORAL

## 2017-02-15 MED ORDER — KETOROLAC TROMETHAMINE 30 MG/ML IJ SOLN
30 MG/ML | Freq: Once | INTRAMUSCULAR | Status: AC
Start: 2017-02-15 — End: 2017-02-15
  Administered 2017-02-15: 16:00:00 30 mg via INTRAVENOUS

## 2017-02-15 MED ORDER — PREDNISONE 10 MG PO TABS
10 MG | ORAL_TABLET | Freq: Every day | ORAL | 0 refills | Status: AC
Start: 2017-02-15 — End: 2017-02-20

## 2017-02-15 MED ORDER — IOPAMIDOL 76 % IV SOLN
76 % | Freq: Once | INTRAVENOUS | Status: AC | PRN
Start: 2017-02-15 — End: 2017-02-15
  Administered 2017-02-15: 16:00:00 85 mL via INTRAVENOUS

## 2017-02-15 MED ORDER — CLINDAMYCIN HCL 300 MG PO CAPS
300 MG | ORAL_CAPSULE | Freq: Four times a day (QID) | ORAL | 0 refills | Status: AC
Start: 2017-02-15 — End: 2017-02-22

## 2017-02-15 MED ORDER — NAPROXEN SODIUM 550 MG PO TABS
550 MG | ORAL_TABLET | Freq: Two times a day (BID) | ORAL | 3 refills | Status: AC
Start: 2017-02-15 — End: 2021-05-30

## 2017-02-15 MED FILL — DEXAMETHASONE SODIUM PHOSPHATE 4 MG/ML IJ SOLN: 4 mg/mL | INTRAMUSCULAR | Qty: 2

## 2017-02-15 MED FILL — KETOROLAC TROMETHAMINE 30 MG/ML IJ SOLN: 30 mg/mL | INTRAMUSCULAR | Qty: 1

## 2017-02-15 NOTE — ED Notes (Signed)
Patient in CT now with this RN     Percell Beltebecca A Goddess Gebbia, RN  02/15/17 1030

## 2017-02-15 NOTE — ED Provider Notes (Signed)
[facility] ED  eMERGENCY dEPARTMENT eNCOUnter        Pt Name: Vicki Hurst  MRN: [id number]  Birthdate Jul 04, 1950  Date of evaluation: 02/15/2017  Provider: Marvetta Gibbons, PA-C  PCP: Berneta Levins, APRN - CNP    This patient was seen and evaluated by the attending physician Maryan Puls, MD.      CHIEF COMPLAINT       Chief Complaint   Patient presents with   . Facial Swelling     right side of face pt is on amoxicillin for possible abscessed tooth. pt hasnt seen dentist yet. pt has swelling nand redness swellign go worse on saturday   . Shortness of Breath       HISTORY OF PRESENT ILLNESS   (Location/Symptom, Timing/Onset, Context/Setting, Quality, Duration, Modifying Factors, Severity)  Note limiting factors.     Vicki Hurst is a 67 y.o. female reasoning for evaluation of right-sided facial swelling.  States she's been having symptoms for the past 3 days.  States that on Saturday she began to have dental pain on the right side of her upper gumline.  She states that she called her primary care provider but was prescribed amoxicillin.  She states that she has been taking this and has taken 6 doses of this without any relief of symptoms.  She states that this morning she began to have increased facial swelling on the right side under her eye and on her cheekbones.  She states that she also has been experiencing some neck pain on the side as well.  She does report that she has a history of arthritis in the neck however this feels different.  She denies fevers or chills she denies nausea or vomiting.  Denies taking medicines for her pain prior to presenting today.    She also reports that since she has been in this emergency department she has developed some right-sided weakness.  She states that she feels weak in her right upper and lower extremities.  She states that she has a heavy sensation when she attempted to lift her leg and arm on the side.  She denies numbness or tingling in the  areas.  She states that the last time she felt normal was around 945 this morning.  She denies any history of previous strokes.  Denies any history of clots.  He does have a history of cancer.  Denies bleeding factors.  Denies any change in speech or vision.    Nursing Notes were all reviewed and agreed with or any disagreements were addressed  in the HPI.    REVIEW OF SYSTEMS    (2-9 systems for level 4, 10 or more for level 5)     Review of Systems   Constitutional: Negative for diaphoresis, fatigue and fever.   HENT: Positive for dental problem and facial swelling. Negative for ear pain, postnasal drip, rhinorrhea and sore throat.    Eyes: Negative for pain, redness and visual disturbance.   Respiratory: Negative for cough, chest tightness and shortness of breath.    Cardiovascular: Negative for chest pain, palpitations and leg swelling.   Gastrointestinal: Negative for abdominal pain, constipation, diarrhea and nausea.   Genitourinary: Negative for dysuria.   Musculoskeletal: Negative for arthralgias, back pain and myalgias.   Skin: Negative for pallor and rash.   Neurological: Positive for weakness. Negative for dizziness, numbness and headaches.   Psychiatric/Behavioral: Negative for agitation, behavioral problems and confusion.  Positives and Pertinent negatives as per HPI.  Except as noted abovein the ROS, all other systems were reviewed and negative.       PAST MEDICAL HISTORY     Past Medical History:   Diagnosis Date   . Breast cancer (HCC)    . Cancer (HCC) 08/24/12    breast cancer   . Cancer (HCC)     Breast   . Gastric reflux          SURGICAL HISTORY     Past Surgical History:   Procedure Laterality Date   . EYE SURGERY     . TUNNELED VENOUS PORT PLACEMENT           CURRENTMEDICATIONS       Discharge Medication List as of 02/15/2017 11:54 AM      CONTINUE these medications which have NOT CHANGED    Details   amoxicillin (AMOXIL) 500 MG capsule Take 500 mg by mouth 3 times dailyHistorical Med       LORazepam (ATIVAN) 0.5 MG tablet Take 1 tablet by mouth nightly, Disp-30 tablet, R-1Print      Loratadine (CLARITIN) 10 MG CAPS Take  by mouth.               ALLERGIES     Sulfa antibiotics; Keflex [cephalexin]; Macrobid [nitrofurantoin monohyd macro]; Nickel; and Sulfa antibiotics    FAMILYHISTORY     History reviewed. No pertinent family history.       SOCIAL HISTORY       Social History     Social History   . Marital status: Married     Spouse name: N/A   . Number of children: N/A   . Years of education: N/A     Social History Main Topics   . Smoking status: Never Smoker   . Smokeless tobacco: Never Used   . Alcohol use No   . Drug use: No   . Sexual activity: Yes     Partners: Male     Other Topics Concern   . None     Social History Narrative    ** Merged History Encounter **            SCREENINGS   NIH Stroke Scale  Interval: Baseline  Level of Consciousness (1a. ): Alert  LOC Questions (1b. ): Answers both correctly  LOC Commands (1c. ): Obeys both correctly  Best Gaze (2. ): Normal  Visual (3. ): No visual loss  Facial Palsy (4. ): Normal  Motor Arm, Left (5a. ): No drift  Motor Arm, Right (5b. ): No drift  Motor Leg, Left (6a. ): No drift  Motor Leg, Right (6b. ): No drift  Limb Ataxia (7. ): Absent  Sensory (8. ): Normal  Best Language (9. ): No aphasia  Dysarthria (10. ): Normal  Extinction and Inattention (11): No neglect  Total: 0         PHYSICAL EXAM    (up to 7 for level 4, 8 or more for level 5)     ED Triage Vitals [02/15/17 0951]   BP Temp Temp Source Pulse Resp SpO2 Height Weight   (!) 184/100 98 F (36.7 C) Oral 84 18 98 % 5\' 8"  (1.727 m) 158 lb (71.7 kg)       Physical Exam   Constitutional: She is oriented to person, place, and time. She appears well-developed and well-nourished.   HENT:   Head: Normocephalic and atraumatic. Not macrocephalic and not microcephalic. Head  is with right periorbital erythema. Head is without raccoon's eyes, without Battle's sign, without abrasion, without  contusion, without laceration and without left periorbital erythema. Hair is normal.       Right Ear: Hearing and tympanic membrane normal.   Left Ear: Hearing and tympanic membrane normal.   Nose: Nose normal.   Mouth/Throat: Oropharynx is clear and moist and mucous membranes are normal. Mucous membranes are not pale and not dry. She does not have dentures. Abnormal dentition. No dental abscesses, lacerations or dental caries. No oropharyngeal exudate or posterior oropharyngeal edema.       Eyes: Pupils are equal, round, and reactive to light. EOM are normal. Right eye exhibits no discharge. Left eye exhibits no discharge.   Neck: Normal range of motion. Neck supple.   Cardiovascular: Normal rate, regular rhythm and normal heart sounds.  Exam reveals no gallop and no friction rub.    No murmur heard.  Pulmonary/Chest: Effort normal and breath sounds normal. No respiratory distress. She has no wheezes. She has no rales.   Abdominal: Soft. Bowel sounds are normal. She exhibits no distension and no mass. There is no tenderness. There is no guarding.   Musculoskeletal: Normal range of motion.   Neurological: She is alert and oriented to person, place, and time. She displays normal reflexes. No cranial nerve deficit or sensory deficit. Coordination normal. GCS eye subscore is 4. GCS verbal subscore is 5. GCS motor subscore is 6.   Reflex Scores:       Tricep reflexes are 2+ on the right side and 2+ on the left side.       Bicep reflexes are 2+ on the right side and 2+ on the left side.       Brachioradialis reflexes are 2+ on the right side and 2+ on the left side.       Patellar reflexes are 2+ on the right side and 2+ on the left side.       Achilles reflexes are 2+ on the right side and 2+ on the left side.  She does have full range of motion against gravity and right upper and right lower extremities bilaterally.  She does have decreased strength against resistance when performing straight leg raises with right leg  and when raising right arm.  Deep tendon reflexes are 2+ without abnormality.   Skin: Skin is warm and dry. She is not diaphoretic.   Psychiatric: She has a normal mood and affect. Her behavior is normal.   Nursing note and vitals reviewed.      DIAGNOSTIC RESULTS   LABS:    Labs Reviewed   CBC WITH AUTO DIFFERENTIAL - Abnormal; Notable for the following:        Result Value    Lymphocytes # 0.9 (*)     All other components within normal limits    Narrative:     Performed at:  [facility]  [address],  [place], [place] [address]   Phone [phone]   COMPREHENSIVE METABOLIC PANEL - Abnormal; Notable for the following:     Glucose 119 (*)     All other components within normal limits    Narrative:     Performed at:  [facility]  [address],  [place], [place] [address]   Phone [phone]   POCT GLUCOSE - Abnormal; Notable for the following:     POC Glucose 113 (*)     All other  components within normal limits    Narrative:     Performed at:  [facility]  [address],  [place], [place] [address]   Phone [phone]   CULTURE BLOOD #1    Narrative:     ORDER#: [id number]                          ORDERED BY: Miguel Dibble, Jarious Lyon  SOURCE: Blood Antecubital-Right            COLLECTED:  02/15/17 10:43  ANTIBIOTICS AT COLL.:                      RECEIVED :  02/15/17 15:29  If child <=2 yrs old please draw pediatric bottle.~Blood Culture #1  Performed at:  [facility]  [address] [place], [place] [address]   Phone [phone]   CULTURE BLOOD #2    Narrative:     ORDER#: [id number]                          ORDERED BY: Miguel Dibble, Antwuan Eckley  SOURCE: Blood lt wrist                     COLLECTED:  02/15/17 10:43  ANTIBIOTICS AT COLL.:                      RECEIVED :  02/15/17 15:29  If child <=2 yrs old please draw pediatric bottle.~Blood Culture #2  Performed at:  [facility]  [address] [place], [place] [address]   Phone [phone]   LACTIC ACID, PLASMA    Narrative:     Performed at:  [facility]  [address],  [place], [place] [address]   Phone [phone]   APTT    Narrative:     Performed at:  [facility]  [address],  [place], [place] [address]   Phone [phone]   PROTIME-INR    Narrative:     Performed at:  [facility]  [address]  [place], [place] [address]   Phone [phone]       All other labs were within normal range or not returned as of this dictation.    EKG: All EKG's are interpreted by the Emergency Department Physician who either signs orCo-signs this chart in the absence of a cardiologist.  Please see their note for interpretation of EKG.      RADIOLOGY:   Non-plain film images such as CT, Ultrasound and MRI are read by the radiologist. Plain radiographic images are visualized andpreliminarily interpreted by the  ED Provider with the below findings:        Interpretation perthe Radiologist below, if available at the time of this note:    CTA HEAD W CONTRAST   Final Result   Addendum 1 of 1   ADDENDUM:   The following addendum is being made to comply with coding criteria, and    does   not substantially change the findings or recommendations of the original   report.      Additional Technique:      3D images were generated on a separate workstation.         Final   No  intracranial arterial large vessel occlusion.  No arterial flow-limiting   stenosis in the neck.      Nonspecific right facial fat stranding may reflect cellulitis in the   appropriate clinical setting.         CTA NECK W CONTRAST   Final Result   Addendum 1 of 1   ADDENDUM:   The following addendum is being made to comply with coding criteria, and    does   not substantially change the findings or recommendations of the original   report.      Additional Technique:      3D  images were generated on a separate workstation.         Final   No intracranial arterial large vessel occlusion.  No arterial flow-limiting   stenosis in the neck.      Nonspecific right facial fat stranding may reflect cellulitis in the   appropriate clinical setting.         CT Head WO Contrast   Final Result   No acute intracranial abnormality.      Small air-fluid level of the right maxillary sinus suggests possible   sinusitis.           No results found.      PROCEDURES   Unless otherwise noted below, none     Procedures    CRITICAL CARE TIME   N/A    CONSULTS:  None      EMERGENCY DEPARTMENT COURSE and DIFFERENTIALDIAGNOSIS/MDM:   Vitals:    Vitals:    02/15/17 0951 02/15/17 1131 02/15/17 1249   BP: (!) 184/100 (!) 151/70 (!) 167/101   Pulse: 84 80 84   Resp: 18 15 15    Temp: 98 F (36.7 C)     TempSrc: Oral     SpO2: 98% 96% 97%   Weight: 158 lb (71.7 kg)     Height: 5\' 8"  (1.727 m)         Patient was given thefollowing medications:  Medications   iopamidol (ISOVUE-370) 76 % injection 85 mL (85 mLs Intravenous Given 02/15/17 1038)   ketorolac (TORADOL) injection 30 mg (30 mg Intravenous Given 02/15/17 1103)   dexamethasone (DECADRON) injection 8 mg (8 mg Oral Given 02/15/17 1218)       This patient presented to the emergency department for evaluation of facial swelling and weakness in left side.       At presentation, vital signs were stable. The patient was in no acute distress. Physical Exam findings were as noted above.    Labs were drawn which showed no leukocytosis or other abnormalities with chemistries.    She was exhibiting some weakness on the left side only against resistance. She was able to perform NIH stroke scale without difficulty. Focused neurolovical exam revealed no other deficits. She had no compromise of grip strength bilaterally.     A code stroke was called.     I discussed with the stroke team and they did not believe the patient was a candidate for TPA as she did not technically meet  stroke scale as she was not showing weakness against gravity.     CT images of head without contrast and CTA of head and neck were ordered and interpreted by radiology. This showed no acute findings. There was a suggestive finding of cellulitis in the face around the area of concern..    I discussed these results with the patient and he/she understood.    After evaluation, she  was reassessed and her strength had returned to show no abnormalities of strength in right upper and lower extremities.     She was given some deacadron for her swelling which did decrease the swelling some. I will treat as a cellulitis and acute reaction with antibiotics and steroids.     I do believe she is safe to be discharged home, however, she was given strict return precautions to present back for any returning or worsening symptoms.     I estimate there is LOW risk for SEVERE CELLULITIS, SEPSIS OR NECROTIZING FASCIITIS,  thus I consider the discharge disposition reasonable.    This patient will be discharged home with the medications listed below. I counseled on how to take these medicines and risks/benefits and AEs. The patient was agreeable to the prescriptions. He/She will follow up with primary care provider or present back for worsening symptoms.           FINAL IMPRESSION      1. Cellulitis, face    2. Right-sided muscle weakness    3. Pain, dental          DISPOSITION/PLAN   DISPOSITION Decision To Discharge 02/15/2017 11:53:15 AM      PATIENT REFERREDTO:  Berneta Levins, APRN - CNP  [address]  [address]  [place] [address]  [phone]    Schedule an appointment as soon as possible for a visit in 2 days      [facility] ED  [address]  [place] [address]  [phone]  Go to   As needed, If symptoms worsen      DISCHARGE MEDICATIONS:  Discharge Medication List as of 02/15/2017 11:54 AM      START taking these medications    Details   clindamycin (CLEOCIN) 300 MG capsule Take 1 capsule by mouth 4 times daily  for 7 days, Disp-28 capsule, R-0Print      naproxen sodium (ANAPROX) 550 MG tablet Take 1 tablet by mouth 2 times daily (with meals), Disp-60 tablet, R-3Print             DISCONTINUED MEDICATIONS:  Discharge Medication List as of 02/15/2017 11:54 AM      STOP taking these medications       ondansetron (ZOFRAN-ODT) 4 MG disintegrating tablet Comments:   Reason for Stopping:         rivaroxaban (XARELTO) 10 MG TABS tablet Comments:   Reason for Stopping:                      (Please note that portions ofthis note were completed with a voice recognition program.  Efforts were made to edit the dictations but occasionally words are mis-transcribed.)    Juliany Daughety, PA-C (electronically signed)            Marvetta Gibbons, PA-C  02/18/17 2210

## 2017-02-15 NOTE — ED Notes (Signed)
PA at bedside to discuss plan of care and discharge with patient.     Percell Beltebecca A Jahmai Finelli, RN  02/15/17 1210

## 2017-02-15 NOTE — ED Notes (Signed)
Patient concerned her throat may swell shut. Would like to speak to provider regarding discharge prior to leaving. PA notified.      Percell Beltebecca A Sahily Biddle, RN  02/15/17 [phone]

## 2017-02-15 NOTE — ED Provider Notes (Signed)
[facility]    Patient: Vicki SersRebecca J Lazenby  MRN: [id number]  DOB: 09/13/1950  Date of Evaluation: 02/15/2017  ED Supervising Physician: Maryan Pulsonya R Evian Salguero, MD    I independently examined and evaluated Vicki Sersebecca J Hurst.    In brief, Vicki SersRebecca J Clyne is a 67 y.o. female that presents to the emergency department with right facial swelling concern for an abscessed tooth, are taking amoxicillin.  No worsening today, is concerned that is not improving.  Also complaining of some right-sided weakness.  No head injury.    Focused exam: No acute distress.  Right-sided facial swelling, no fluctuance or crepitus.  Cranial nerves II through XII intact.  Strength 4 out of 5 right upper and lower extremities, 5 out of 5 upper and lower left extremities on initial exam; when repeating the exam with distraction, strength 5 out of 5 throughout.    Brief ED course/MDM: Patient initially reporting right-sided weakness in addition to the facial swelling, negative for strep.  On distraction, patient's neuro examination completely normal, strength 5 out of 5 throughout, no pronator drift or dysmetria.  No true weakness.  We'll continue to treat likely abscessed tooth.    All diagnostic, treatment, and disposition decisions were made by myself in conjunction with the APP/Resident. For all further details of the patient's emergency department visit, please see their documentation.    (Please note that portions of this note may have been completed with a voice recognition program. Efforts were made to edit the dictations but occasionally words are mis-transcribed.)    Maryan Pulsonya R Dakai Braithwaite, MD  [employer]        Maryan Pulsonya R Cuahutemoc Attar, MD  02/18/17 2250

## 2017-02-15 NOTE — ED Notes (Signed)
Code stroke called by C. Kirschner, PA     Percell Beltebecca A Harris Kistler, RN  02/15/17 1028

## 2017-02-15 NOTE — ED Notes (Signed)
Patient transferred from the ED in stable condition. Patient verbalized understanding of return precautions and understanding of medications. IV removed and dressing applied. No additional needs or concerns expressed upon discharge.      Percell Beltebecca A Skilar Marcou, RN  02/15/17 1251

## 2017-02-15 NOTE — ED Notes (Signed)
PA at bedside for assessment.      Percell Beltebecca A Maleiyah Releford, RN  02/15/17 [phone]

## 2017-02-15 NOTE — Discharge Instructions (Signed)
Follow-up with orthopedics to better evaluate your chronic neck problem.  Use antibiotics as prescribed.  Discontinue amoxicillin.  Follow up with primary care provider this week to ensure this problem is resolving.  If any symptoms worsen present back to the emergency department.

## 2017-02-20 LAB — CULTURE, BLOOD 2: Culture, Blood 2: NO GROWTH

## 2017-02-20 LAB — CULTURE BLOOD #1: Blood Culture, Routine: NO GROWTH

## 2021-05-30 ENCOUNTER — Emergency Department: Admit: 2021-05-30 | Payer: PRIVATE HEALTH INSURANCE | Primary: Family Medicine

## 2021-05-30 ENCOUNTER — Inpatient Hospital Stay
Admission: EM | Admit: 2021-05-30 | Discharge: 2021-05-31 | Disposition: A | Discharge status: Home / Self Care | Payer: PRIVATE HEALTH INSURANCE | Admitting: Internal Medicine

## 2021-05-30 DIAGNOSIS — R079 Chest pain, unspecified: Secondary | ICD-10-CM

## 2021-05-30 DIAGNOSIS — K8020 Calculus of gallbladder without cholecystitis without obstruction: Principal | ICD-10-CM

## 2021-05-30 LAB — CBC WITH AUTO DIFFERENTIAL
Basophils %: 1.8 %
Basophils Absolute: 0.1 10*3/uL (ref 0.0–0.2)
Eosinophils %: 2 %
Eosinophils Absolute: 0.1 10*3/uL (ref 0.0–0.6)
Hematocrit: 40.6 % (ref 36.0–48.0)
Hemoglobin: 13.3 g/dL (ref 12.0–16.0)
Lymphocytes %: 15.5 %
Lymphocytes Absolute: 0.8 10*3/uL — ABNORMAL LOW (ref 1.0–5.1)
MCH: 28.4 pg (ref 26.0–34.0)
MCHC: 32.7 g/dL (ref 31.0–36.0)
MCV: 86.7 fL (ref 80.0–100.0)
MPV: 8.9 fL (ref 5.0–10.5)
Monocytes %: 7 %
Monocytes Absolute: 0.4 10*3/uL (ref 0.0–1.3)
Neutrophils %: 73.7 %
Neutrophils Absolute: 3.8 10*3/uL (ref 1.7–7.7)
Platelets: 227 10*3/uL (ref 135–450)
RBC: 4.68 M/uL (ref 4.00–5.20)
RDW: 13.3 % (ref 12.4–15.4)
WBC: 5.2 10*3/uL (ref 4.0–11.0)

## 2021-05-30 LAB — URINALYSIS WITH REFLEX TO CULTURE
Bilirubin Urine: NEGATIVE
Blood, Urine: NEGATIVE
Glucose, Ur: NEGATIVE mg/dL
Ketones, Urine: NEGATIVE mg/dL
Leukocyte Esterase, Urine: NEGATIVE
Nitrite, Urine: NEGATIVE
Protein, UA: NEGATIVE mg/dL
Specific Gravity, UA: 1.01 (ref 1.005–1.030)
Urobilinogen, Urine: 0.2 E.U./dL (ref <2.0)
pH, UA: 6 (ref 5.0–8.0)

## 2021-05-30 LAB — BASIC METABOLIC PANEL
Anion Gap: 11 (ref 3–16)
BUN: 21 mg/dL — ABNORMAL HIGH (ref 7–20)
CO2: 24 mmol/L (ref 21–32)
Calcium: 9.9 mg/dL (ref 8.3–10.6)
Chloride: 104 mmol/L (ref 99–110)
Creatinine: 0.9 mg/dL (ref 0.6–1.2)
Est, Glom Filt Rate: >60 (ref >60)
Glucose: 128 mg/dL — ABNORMAL HIGH (ref 70–99)
Potassium: 4.4 mmol/L (ref 3.5–5.1)
Sodium: 139 mmol/L (ref 136–145)

## 2021-05-30 LAB — HEPATIC FUNCTION PANEL
ALT: 44 U/L — ABNORMAL HIGH (ref 10–40)
AST: 103 U/L — ABNORMAL HIGH (ref 15–37)
Albumin: 4.4 g/dL (ref 3.4–5.0)
Alkaline Phosphatase: 102 U/L (ref 40–129)
Bilirubin, Direct: <0.2 mg/dL (ref 0.0–0.3)
Total Bilirubin: 0.5 mg/dL (ref 0.0–1.0)
Total Protein: 7.7 g/dL (ref 6.4–8.2)

## 2021-05-30 LAB — PROTIME-INR
INR: 0.91 (ref 0.84–1.16)
Protime: 12.3 s (ref 11.5–14.8)

## 2021-05-30 LAB — TROPONIN
Troponin, High Sensitivity: <6 ng/L (ref 0–14)
Troponin, High Sensitivity: 9 ng/L (ref 0–14)

## 2021-05-30 LAB — BRAIN NATRIURETIC PEPTIDE: Pro-BNP: 85 pg/mL (ref 0–124)

## 2021-05-30 LAB — MAGNESIUM: Magnesium: 2.1 mg/dL (ref 1.80–2.40)

## 2021-05-30 LAB — LIPASE: Lipase: 218 U/L — ABNORMAL HIGH (ref 13.0–60.0)

## 2021-05-30 MED ORDER — SODIUM CHLORIDE 0.9 % IV BOLUS
0.9 % | Freq: Once | INTRAVENOUS | Status: AC
Start: 2021-05-30 — End: 2021-05-30
  Administered 2021-05-30: 21:00:00 1000 mL via INTRAVENOUS

## 2021-05-30 MED ORDER — NITROGLYCERIN 2 % TD OINT
2 % | Freq: Once | TRANSDERMAL | Status: AC
Start: 2021-05-30 — End: 2021-05-30
  Administered 2021-05-31: 1 [in_us] via TOPICAL

## 2021-05-30 MED ORDER — ASPIRIN 81 MG PO CHEW
81 MG | Freq: Once | ORAL | Status: AC
Start: 2021-05-30 — End: 2021-05-30
  Administered 2021-05-30: 22:00:00 243 mg via ORAL

## 2021-05-30 MED ORDER — KETOROLAC TROMETHAMINE 30 MG/ML IJ SOLN
30 MG/ML | Freq: Once | INTRAMUSCULAR | Status: AC
Start: 2021-05-30 — End: 2021-05-30
  Administered 2021-05-30: 23:00:00 30 mg via INTRAVENOUS

## 2021-05-30 MED ORDER — ONDANSETRON HCL 4 MG/2ML IJ SOLN
4 MG/2ML | Freq: Once | INTRAMUSCULAR | Status: AC
Start: 2021-05-30 — End: 2021-05-30
  Administered 2021-05-30: 22:00:00 4 mg via INTRAVENOUS

## 2021-05-30 MED ORDER — FAMOTIDINE (PF) 20 MG/2ML IV SOLN
20 MG/2ML | Freq: Once | INTRAVENOUS | Status: AC
Start: 2021-05-30 — End: 2021-05-30
  Administered 2021-05-30: 22:00:00 20 mg via INTRAVENOUS

## 2021-05-30 MED ORDER — ALUM & MAG HYDROXIDE-SIMETH 200-200-20 MG/5ML PO SUSP
200-200-20 MG/5ML | Freq: Once | ORAL | Status: AC
Start: 2021-05-30 — End: 2021-05-30
  Administered 2021-05-30: 22:00:00 30 mL via ORAL

## 2021-05-30 MED ORDER — IOPAMIDOL 76 % IV SOLN
76 % | Freq: Once | INTRAVENOUS | Status: AC | PRN
Start: 2021-05-30 — End: 2021-05-30
  Administered 2021-05-30: 22:00:00 75 mL via INTRAVENOUS

## 2021-05-30 MED FILL — KETOROLAC TROMETHAMINE 30 MG/ML IJ SOLN: 30 MG/ML | INTRAMUSCULAR | Qty: 1

## 2021-05-30 MED FILL — FAMOTIDINE (PF) 20 MG/2ML IV SOLN: 20 MG/2ML | INTRAVENOUS | Qty: 2

## 2021-05-30 MED FILL — ONDANSETRON HCL 4 MG/2ML IJ SOLN: 4 MG/2ML | INTRAMUSCULAR | Qty: 2

## 2021-05-30 MED FILL — MAG-AL PLUS 200-200-20 MG/5ML PO LIQD: 200-200-20 MG/5ML | ORAL | Qty: 30

## 2021-05-30 MED FILL — ASPIRIN 81 MG PO CHEW: 81 MG | ORAL | Qty: 3

## 2021-05-30 NOTE — ED Notes (Addendum)
05/30/21 [place], RN  05/30/21 1752

## 2021-05-30 NOTE — ED Provider Notes (Signed)
I independently performed a history and physical on Vicki Hurst.   I personally saw the patient and performed a substantive portion of the visit including all aspects of the medical decision making.  All diagnostic, treatment, and disposition decisions were made by myself in conjunction with the advanced practice provider.  I have participated in the medical decision making and directed the treatment plan and disposition of the patient.    For further details of Vicki Hurst emergency department encounter, please see the advanced practice provider's documentation.    CHIEF COMPLAINT  Chief Complaint   Patient presents with    Chest Pain     Squeezing pain Hurst chest through to back .  Did vomit as well.       Briefly, Vicki Hurst is a 71 y.o. female  who presents to the ED complaining of lower chest pain radiating to her back associated with nausea and multiple rounds of emesis    FOCUSED PHYSICAL EXAMINATION  BP (!) 205/87   Pulse 83   Temp 97.7 F (36.5 C) (Oral)   Resp 26   Ht 5\' 8"  (1.727 m)   Wt 155 lb (70.3 kg)   SpO2 100%   BMI 23.57 kg/m      Focused physical examination:  General appearance:  Cooperative.  No acute distress.   Skin:  Warm. Dry.   Eye:  Extraocular movements intact.     Ears, nose, mouth and throat:  Oral mucosa moist,  Neck:  Trachea midline.     Heart:  Regular rate and rhythm  Perfusion:  intact  Respiratory:  Respirations nonlabored. Lungs clear to auscultation bilaterally.     Abdominal:   Non distended.  Tenderness to palpation in the epigastrium with no rebound or guarding  Neurological:  Alert and oriented x 3.  Moves all extremities spontaneously  Musculoskeletal:   Normal ROM, no deformities          Psychiatric:  Normal mood      EKG *Interpreted by me*: Sinus rhythm rate of 88 bpm with no ST elevation or arrhythmia    Differential Diagnosis: ACS, pneumonia, PE, pancreatitis, bowel obstruction, biliary colic    Patient presents emergency department a  complaining of chest pain.  While she complains of chest pain she is actually pointing more to her abdomen is having significant GI symptoms such as nausea and vomiting and has tenderness in the epigastrium.  Suspect possible GI source.  Laboratory studies do note an elevated lipase.  Question pancreatitis.  Right upper quadrant ultrasound shows cholelithiasis without cholecystitis and CT scan shows similar findings without obvious signs of pancreatitis.  No vascular abnormalities appreciated.  Still, given her complaints of actual chest pain despite all the GI symptoms did question possible cardiac etiology and did perform an EKG which was nondiagnostic.  Initial troponin undetectable though repeat troponin was found to be 9.  This technically is negative, but does show an increase of 1.4x over the hour.  Possible underlying cardiac etiology.  Given the potential for acute new onset pancreatitis or possibly ACS suspect admission warranted.  Otherwise is feeling better with treatment here    History From: Patient         Chronic Conditions: Noted in HPI    CONSULTS: (Who and What was discussed)  None            During the patient's ED course, the patient was given:  Medications   ondansetron (ZOFRAN) injection 4 mg (  4 mg IntraVENous Given 05/30/21 1809)   0.9 % sodium chloride bolus (0 mLs IntraVENous Stopped 05/30/21 1856)   aspirin chewable tablet 243 mg (243 mg Oral Given 05/30/21 1745)   aluminum & magnesium hydroxide-simethicone (MAALOX) 200-200-20 MG/5ML suspension 30 mL (30 mLs Oral Given 05/30/21 1809)   famotidine (PEPCID) 20 mg in sodium chloride (PF) 0.9 % 10 mL injection (20 mg IntraVENous Given 05/30/21 1810)   ketorolac (TORADOL) injection 30 mg (30 mg IntraVENous Given 05/30/21 1905)   iopamidol (ISOVUE-370) 76 % injection 75 mL (75 mLs IntraVENous Given 05/30/21 1826)          DISPOSITION  Admission    This chart was created using Dragon dictation software.  Efforts were made by me to ensure accuracy,  however some errors may be present due to limitations of this technology.            Sharol Harness, MD  05/30/21 1931

## 2021-05-30 NOTE — H&P (Signed)
Hospitalist Admission Note    NAME: Vicki Hurst   DOB:  [birth year]   MRN:  [id number]     Date/Time:  05/30/2021 7:57 PM    Patient PCP: Ronney Asters, MD  _____________________________________________________________________  Given the patient's current clinical presentation, I have a high level of concern for decompensation if discharged from the emergency department.  Complex decision making was performed, which includes reviewing the patient's available past medical records, laboratory results, and x-ray films.       My assessment of this patient's clinical condition and my plan of care is as follows.    Assessment / Plan:  71 y.o. female  who presents to the ED complaining of lower chest pain radiating to her back associated with nausea and multiple rounds of emesis. While she complains of chest pain she is actually pointing more to her abdomen is having significant GI symptoms such as nausea and vomiting and has tenderness in the epigastrium. Laboratory studies do note an elevated lipase.  Question pancreatitis.  Right upper quadrant ultrasound shows cholelithiasis without cholecystitis and CT scan shows similar findings without obvious signs of pancreatitis.  EKG which was nondiagnostic.  Initial troponin undetectable though repeat troponin was found to be 9. Given the potential for acute new onset pancreatitis or possibly ACS suspect admission warranted.  Otherwise is feeling better with treatment here .Heart score is 4 with 1 for suspicious, 2 for age, 1 for risk (hypertension).        Lower Chest Pain  Upper abd pain    Acute Pancreatitis   Cholelithiasis  Elevated LFTs     Hypertension  Uncontrolled   pt denies h/o HTN  Could be 2/2 to pain/anxiety  Will Monitor      Admit to tele  Serial trop  AsA  Npo  IVF  Iv analgesia  Card consult ordered  Trail of PPI  GI/Surgry consult  Check a1c FLP          Code Status: FULL  DVT Prophylaxis: SQ LOVENOX  GI Prophylaxis: not indicated          Subjective:    CHIEF COMPLAINT:  Chest Pain  Squeezing pain center chest through to back .  Did vomit as well.      HISTORY OF PRESENT ILLNESS:     Vicki Hurst is a 71 y.o. female who presents to the emergency department by private vehicle.  The patient states got home from work 2:30 PM.  He had couple coffee and cookies as usual.  At 3 PM she experienced central chest tightness followed by nausea and emesis 10-14 times.  States the chest tightness referred to back and bilateral lower chest.  Chest pain complaint did not very with deep breath or movement.  No lateralization.  No diaphoresis or shortness of breath.  She reported taking 2 Gaviscon and shortly thereafter she experienced the onset of emesis.  In addition, the patient did take aspirin 81 mg.  Patient states had similar about 1.5 months ago.  Short duration and not as severe.  Patient indicates duration approximately 30 minutes and resolved by 3:30 PM.  She contacted PCP who recommended ED evaluation.     Patient believes could be her gallstones diagnosed 1.5 years ago by CT scan at [facility].     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     Oncology Dr. Magdalene River, surgeon Ammie Ferrier, urology [facility].      did  review back in the patient records from December 05, 2012 showing evidence of cholelithiasis which also was seen at a more recent x-ray imaging.            ED COURSE:  This patient presenting with complaint of chest pressure lower chest and epigastrium beginning at 3 PM with subsequent nausea and vomiting 10-14 times.    Pain went to her back and around her upper abdomen and lower chest.  Heart score is 4.  She took aspirin 81 mg at home.  I gave her additional 3 aspirin in the ED.  Patient was pain-free upon ED arrival.  While in the ED she had a return of her discomfort.  She was given Toradol 30 mg IV x1 with minimal effect on the pain.  I reassessed the patient's several times.   Last assessment at 7:20 PM.  BP is 205/87.  She is complaining of the lower chest tightness at a 3/10.  Discussed with attending physician.  At this time the patient will be started on Nitropaste 1 inch.     Since initial troponin less than 6 and repeat troponin results at 9.  Cannot exclude an emerging cardiac problem.     Patient's lipase is elevated at 218.  Ultrasound of right upper quadrant and CT scan abdomen pelvis with IV Obtain showing no obvious pancreatic abnormality.  Cholelithiasis noted.  Cannot exclude gallbladder induced pancreatitis.  At 1 point during the patient's ED visit the patient complaining of some belching and indigestion-like symptoms.  I did order Maalox 30 mL.  Minimal effect.         This patient arriving home at 2:30 PM from work.  At 3 PM she noted some mild indigestion and belching and lower chest tightness/pain, epigastric pain and circumferential pain around the lower chest and upper abdomen.  Patient with known cholelithiasis.  Ultrasound and CT scan obtained.  Patient is elevated lipase at 218 and mild elevation in LFTs noting ALT at 44 and AST at 103.  The patient with heart score 4.  No known cardiac disease.  The patient's initial troponin less than 6.  Repeat troponin 9.  Patient with indigestion and belching with clear in ED.  Patient a bit hypertensive at 205/87.  Patient took aspirin 81 mg at home.  I provided 3 additional for a total of 324 mg of aspirin.  At approximately 7:30 PM order for Nitropaste 1 inch.     Patient be admitted with diagnosis chest pain, abdominal pain, possible gallbladder pancreatitis, elevated LFTs and treated hypertension.        During the patient's ED course, the patient was given:  Medications   ondansetron (ZOFRAN) injection 4 mg (4 mg IntraVENous Given 05/30/21 1809)   0.9 % sodium chloride bolus (0 mLs IntraVENous Stopped 05/30/21 1856)   aspirin chewable tablet 243 mg (243 mg Oral Given 05/30/21 1745)   aluminum & magnesium  hydroxide-simethicone (MAALOX) 200-200-20 MG/5ML suspension 30 mL (30 mLs Oral Given 05/30/21 1809)   famotidine (PEPCID) 20 mg in sodium chloride (PF) 0.9 % 10 mL injection (20 mg IntraVENous Given 05/30/21 1810)   ketorolac (TORADOL) injection 30 mg (30 mg IntraVENous Given 05/30/21 1905)   iopamidol (ISOVUE-370) 76 % injection 75 mL (75 mLs IntraVENous Given 05/30/21 1826)       CT ABDOMEN PELVIS W IV CONTRAST Additional Contrast? None  Final Result  Cholelithiasis.  Colonic diverticulosis without acute diverticulitis.        US ABDOMEN LIMITED  Specify organ? LIVER, GALLBLADDER, PANCREAS  Final Result  Cholelithiasis without other findings to suggest acute cholecystitis.        XR CHEST PORTABLE  Final Result  No acute cardiopulmonary findings    We were asked to admit for work up and evaluation of the above problems.     Past Medical History:   Diagnosis Date    Breast cancer (HCC)     Cancer (HCC) 08/24/12    breast cancer    Cancer (HCC)     Breast    Gastric reflux         Past Surgical History:   Procedure Laterality Date    EYE SURGERY      TUNNELED VENOUS PORT PLACEMENT         Social History     Tobacco Use    Smoking status: Never    Smokeless tobacco: Never   Substance Use Topics    Alcohol use: No        History reviewed. No pertinent family history.  Allergies   Allergen Reactions    Sulfa Antibiotics Swelling    Keflex [Cephalexin] Swelling    Macrobid [Nitrofurantoin Monohyd Macro]     Nickel     Sulfa Antibiotics Hives        Prior to Admission medications    Not on File       REVIEW OF SYSTEMS:     I am not able to complete the review of systems because:   The patient is intubated and sedated    The patient has altered mental status due to his acute medical problems    The patient has baseline aphasia from prior stroke(s)    The patient has baseline dementia and is not reliable historian    The patient is in acute medical distress and unable to provide information           Total of 12 systems reviewed  as follows:       POSITIVE= underlined text  Negative = text not underlined  General:  fever, chills, sweats, generalized weakness, weight loss/gain,      loss of appetite   Eyes:    blurred vision, eye pain, loss of vision, double vision  ENT:    rhinorrhea, pharyngitis   Respiratory:   cough, sputum production, SOB, DOE, wheezing, pleuritic pain   Cardiology:   chest pain, palpitations, orthopnea, PND, edema, syncope   Gastrointestinal:  abdominal pain , N/V, diarrhea, dysphagia, constipation, bleeding   Genitourinary:  frequency, urgency, dysuria, hematuria, incontinence   Muskuloskeletal :  arthralgia, myalgia, back pain  Hematology:  easy bruising, nose or gum bleeding, lymphadenopathy   Dermatological: rash, ulceration, pruritis, color change / jaundice  Endocrine:   hot flashes or polydipsia   Neurological:  headache, dizziness, confusion, focal weakness, paresthesia,     Speech difficulties, memory loss, gait difficulty  Psychological: Feelings of anxiety, depression, agitation    Objective:   VITALS:    Vitals:    05/30/21 1932   BP: (!) 199/84   Pulse: 78   Resp: 15   Temp:    SpO2: 99%       PHYSICAL EXAM:    Constitutional:       Appearance: Normal appearance. She is well-developed and normal weight.   HENT:      Head: Normocephalic and atraumatic.      Right Ear: External ear normal.      Left Ear: External ear normal.   Eyes:  General:         Right eye: No discharge.         Left eye: No discharge.      Conjunctiva/sclera: Conjunctivae normal.   Cardiovascular:      Rate and Rhythm: Normal rate and regular rhythm.      Heart sounds: Murmur heard.   Pulmonary:      Effort: Pulmonary effort is normal.      Breath sounds: Normal breath sounds.   Abdominal:      General: Abdomen is flat. Bowel sounds are normal.      Palpations: Abdomen is soft.      Tenderness: There is abdominal tenderness. There is no right CVA tenderness or left CVA tenderness.   Musculoskeletal:         General: Normal range of  motion.      Cervical back: Normal range of motion and neck supple.      Right lower leg: No edema.      Left lower leg: No edema.   Skin:     General: Skin is warm and dry.   Neurological:      General: No focal deficit present.      Mental Status: She is alert and oriented to person, place, and time. Mental status is at baseline.   Psychiatric:         Mood and Affect: Mood normal.         Behavior: Behavior normal.         Thought Content: Thought content normal.         Judgment: Judgment normal.   _______________________________________________________________________  Care Plan discussed with:    Comments   Patient y    Family      RN y    Comptroller:  y Ed md   _______________________________________________________________________  Expected  Disposition:   Home with Family x   HH/PT/OT/RN    SNF/LTC    SAHR    ________________________________________________________________________  TOTAL TIME:   55  Minutes    Critical Care Provided     Minutes non procedure based      Comments    x Reviewed previous records   >50% of visit spent in counseling and coordination of care x Discussion with patient and/or family and questions answered       Given the patient's current clinical presentation, I have a high level of concern for decompensation if discharged from the ED. Complex decision making was performed which includes reviewing the patient's available past medical records, laboratory results, and Xray films. I have also directly communicated my plan and discussed this case with the involved ED physician.     ____________________________________________________________________  Deforest Hoyles, MD    Procedures: see electronic medical records for all procedures/Xrays and details which were not copied into this note but were reviewed prior to creation of Plan.    LAB DATA REVIEWED:    Recent Results (from the past 24 hour(s))   EKG 12 Lead    Collection Time: 05/30/21  4:14 PM   Result  Value Ref Range    Ventricular Rate 88 BPM    Atrial Rate 88 BPM    P-R Interval 116 ms    QRS Duration 76 ms    Q-T Interval 358 ms    QTc Calculation (Bazett) 433 ms    P Axis 60  degrees    R Axis 48 degrees    T Axis 47 degrees    Diagnosis       Normal sinus rhythmNormal ECGNo previous ECGs available   CBC with Auto Differential    Collection Time: 05/30/21  5:16 PM   Result Value Ref Range    WBC 5.2 4.0 - 11.0 K/uL    RBC 4.68 4.00 - 5.20 M/uL    Hemoglobin 13.3 12.0 - 16.0 g/dL    Hematocrit 16.1 09.6 - 48.0 %    MCV 86.7 80.0 - 100.0 fL    MCH 28.4 26.0 - 34.0 pg    MCHC 32.7 31.0 - 36.0 g/dL    RDW 04.5 40.9 - 81.1 %    Platelets 227 135 - 450 K/uL    MPV 8.9 5.0 - 10.5 fL    Neutrophils % 73.7 %    Lymphocytes % 15.5 %    Monocytes % 7.0 %    Eosinophils % 2.0 %    Basophils % 1.8 %    Neutrophils Absolute 3.8 1.7 - 7.7 K/uL    Lymphocytes Absolute 0.8 (L) 1.0 - 5.1 K/uL    Monocytes Absolute 0.4 0.0 - 1.3 K/uL    Eosinophils Absolute 0.1 0.0 - 0.6 K/uL    Basophils Absolute 0.1 0.0 - 0.2 K/uL   Protime-INR    Collection Time: 05/30/21  5:16 PM   Result Value Ref Range    Protime 12.3 11.5 - 14.8 sec    INR 0.91 0.84 - 1.16   BNP    Collection Time: 05/30/21  5:16 PM   Result Value Ref Range    Pro-BNP 85 0 - 124 pg/mL   Troponin    Collection Time: 05/30/21  5:16 PM   Result Value Ref Range    Troponin, High Sensitivity <6 0 - 14 ng/L   Lipase    Collection Time: 05/30/21  5:16 PM   Result Value Ref Range    Lipase 218.0 (H) 13.0 - 60.0 U/L   Hepatic Function Panel    Collection Time: 05/30/21  5:16 PM   Result Value Ref Range    Total Protein 7.7 6.4 - 8.2 g/dL    Albumin 4.4 3.4 - 5.0 g/dL    Alkaline Phosphatase 102 40 - 129 U/L    ALT 44 (H) 10 - 40 U/L    AST 103 (H) 15 - 37 U/L    Total Bilirubin 0.5 0.0 - 1.0 mg/dL    Bilirubin, Direct <9.1 0.0 - 0.3 mg/dL    Bilirubin, Indirect see below 0.0 - 1.0 mg/dL   Basic Metabolic Panel    Collection Time: 05/30/21  5:16 PM   Result Value Ref Range     Sodium 139 136 - 145 mmol/L    Potassium 4.4 3.5 - 5.1 mmol/L    Chloride 104 99 - 110 mmol/L    CO2 24 21 - 32 mmol/L    Anion Gap 11 3 - 16    Glucose 128 (H) 70 - 99 mg/dL    BUN 21 (H) 7 - 20 mg/dL    Creatinine 0.9 0.6 - 1.2 mg/dL    Est, Glom Filt Rate >60 >60    Calcium 9.9 8.3 - 10.6 mg/dL   Magnesium    Collection Time: 05/30/21  5:16 PM   Result Value Ref Range    Magnesium 2.10 1.80 - 2.40 mg/dL   Urinalysis with Reflex to Culture    Collection Time:  05/30/21  6:05 PM    Specimen: Urine   Result Value Ref Range    Color, UA Yellow Straw/Yellow    Clarity, UA Clear Clear    Glucose, Ur Negative Negative mg/dL    Bilirubin Urine Negative Negative    Ketones, Urine Negative Negative mg/dL    Specific Gravity, UA 1.010 1.005 - 1.030    Blood, Urine Negative Negative    pH, UA 6.0 5.0 - 8.0    Protein, UA Negative Negative mg/dL    Urobilinogen, Urine 0.2 <2.0 E.U./dL    Nitrite, Urine Negative Negative    Leukocyte Esterase, Urine Negative Negative    Microscopic Examination Not Indicated     Urine Type NotGiven     Urine Reflex to Culture Not Indicated    Troponin    Collection Time: 05/30/21  6:18 PM   Result Value Ref Range    Troponin, High Sensitivity 9 0 - 14 ng/L

## 2021-05-30 NOTE — ED Notes (Signed)
Report to [place].     Don Perking, RN  05/30/21 [phone]

## 2021-05-30 NOTE — ED Notes (Signed)
209 [email or web address] ;[place]

## 2021-05-30 NOTE — ED Provider Notes (Signed)
[facility]  EMERGENCY DEPARTMENT ENCOUNTER        Pt Name: Vicki Hurst  MRN: [id number]  Birthdate [birth year]  Date of evaluation: 05/30/2021  Provider: Ike Bene, PA-C  PCP: Ronney Asters, MD  Note Started: 4:28 PM EDT 05/30/21       I have seen and evaluated this patient with my supervising physician Sharol Harness, MD.      CHIEF COMPLAINT       Chief Complaint   Patient presents with    Chest Pain     Squeezing pain center chest through to back .  Did vomit as well.       HISTORY OF PRESENT ILLNESS: 1 or more Elements     History From: Patient    Limitations to history : None    Vicki Hurst is a 71 y.o. female who presents to the emergency department by private vehicle.  The patient states got home from work 2:30 PM.  He had couple coffee and cookies as usual.  At 3 PM she experienced central chest tightness followed by nausea and emesis 10-14 times.  States the chest tightness referred to back and bilateral lower chest.  Chest pain complaint did not very with deep breath or movement.  No lateralization.  No diaphoresis or shortness of breath.  She reported taking 2 Gaviscon and shortly thereafter she experienced the onset of emesis.  In addition, the patient did take aspirin 81 mg.  Patient states had similar about 1.5 months ago.  Short duration and not as severe.  Patient indicates duration approximately 30 minutes and resolved by 3:30 PM.  She contacted PCP who recommended ED evaluation.    Patient believes could be her gallstones diagnosed 1.5 years ago by CT scan at [facility].    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    Oncology Dr. Magdalene River, surgeon Ammie Ferrier, urology Natalia Leatherwood.    I did review back in the patient records from December 05, 2012 showing evidence of cholelithiasis which also was seen at a more recent x-ray imaging.    Heart score is 4 with 1 for suspicious, 2 for age, 1 for  risk (hypertension).    Nursing Notes were all reviewed and agreed with or any disagreements were addressed in the HPI.    REVIEW OF SYSTEMS :      Review of Systems    Positives and Pertinent negatives as per HPI.     SURGICAL HISTORY     Past Surgical History:   Procedure Laterality Date    EYE SURGERY      TUNNELED VENOUS PORT PLACEMENT         CURRENTMEDICATIONS       Current Discharge Medication List        CONTINUE these medications which have NOT CHANGED    Details   alum Hydroxide-Mag Carbonate (GAVISCON EXTRA STRENGTH) 160-105 MG CHEW Take by mouth      vitamin D (CHOLECALCIFEROL) 50 MCG (2000 UT) TABS tablet Take 1 tablet by mouth daily             ALLERGIES     Sulfa antibiotics, Keflex [cephalexin], Macrobid [nitrofurantoin monohyd macro], Nickel, and Sulfa antibiotics    FAMILYHISTORY     History reviewed. No pertinent family history.     SOCIAL HISTORY       Social History     Tobacco Use    Smoking status:  Never    Smokeless tobacco: Never   Substance Use Topics    Alcohol use: No    Drug use: No       SCREENINGS        Glasgow Coma Scale  Eye Opening: Spontaneous  Best Verbal Response: Oriented  Best Motor Response: Obeys commands  Glasgow Coma Scale Score: 15                CIWA Assessment  BP: (!) 152/75  Pulse: 58           PHYSICAL EXAM  1 or more Elements     ED Triage Vitals [05/30/21 1615]   BP Temp Temp Source Pulse Respirations SpO2 Height Weight   (!) 198/82 97.7 F (36.5 C) Oral 90 18 98 % -- --       Physical Exam  Vitals and nursing note reviewed.   Constitutional:       Appearance: Normal appearance. She is well-developed and normal weight.   HENT:      Head: Normocephalic and atraumatic.      Right Ear: External ear normal.      Left Ear: External ear normal.   Eyes:      General:         Right eye: No discharge.         Left eye: No discharge.      Conjunctiva/sclera: Conjunctivae normal.   Cardiovascular:      Rate and Rhythm: Normal rate and regular rhythm.      Heart sounds:  Murmur heard.   Pulmonary:      Effort: Pulmonary effort is normal.      Breath sounds: Normal breath sounds.   Abdominal:      General: Abdomen is flat. Bowel sounds are normal.      Palpations: Abdomen is soft.      Tenderness: There is abdominal tenderness. There is no right CVA tenderness or left CVA tenderness.   Musculoskeletal:         General: Normal range of motion.      Cervical back: Normal range of motion and neck supple.      Right lower leg: No edema.      Left lower leg: No edema.   Skin:     General: Skin is warm and dry.   Neurological:      General: No focal deficit present.      Mental Status: She is alert and oriented to person, place, and time. Mental status is at baseline.   Psychiatric:         Mood and Affect: Mood normal.         Behavior: Behavior normal.         Thought Content: Thought content normal.         Judgment: Judgment normal.         DIAGNOSTIC RESULTS   LABS:    Labs Reviewed   CBC WITH AUTO DIFFERENTIAL - Abnormal; Notable for the following components:       Result Value    Lymphocytes Absolute 0.8 (*)     All other components within normal limits   LIPASE - Abnormal; Notable for the following components:    Lipase 218.0 (*)     All other components within normal limits   HEPATIC FUNCTION PANEL - Abnormal; Notable for the following components:    ALT 44 (*)     AST 103 (*)     All other components  within normal limits   BASIC METABOLIC PANEL - Abnormal; Notable for the following components:    Glucose 128 (*)     BUN 21 (*)     All other components within normal limits   COMPREHENSIVE METABOLIC PANEL W/ REFLEX TO MG FOR LOW K - Abnormal; Notable for the following components:    Glucose 107 (*)     Total Protein 6.1 (*)     ALT 352 (*)     AST 327 (*)     All other components within normal limits   CBC WITH AUTO DIFFERENTIAL - Abnormal; Notable for the following components:    WBC 2.6 (*)     Hemoglobin 11.5 (*)     Hematocrit 34.1 (*)     Neutrophils Absolute 1.2 (*)      Lymphocytes Absolute 0.9 (*)     All other components within normal limits   TROPONIN - Abnormal; Notable for the following components:    Troponin, High Sensitivity 23 (*)     All other components within normal limits   TROPONIN - Abnormal; Notable for the following components:    Troponin, High Sensitivity 17 (*)     All other components within normal limits   PROTIME-INR   BRAIN NATRIURETIC PEPTIDE   TROPONIN   TROPONIN   URINALYSIS WITH REFLEX TO CULTURE   MAGNESIUM   LIPID PANEL   LIPASE   HEMOGLOBIN A1C   HEPATITIS PANEL, ACUTE       When ordered only abnormal lab results are displayed. All other labs were within normal range or not returned as of this dictation.    EKG: When ordered, EKG's are interpreted by the Emergency Department Physician in the absence of a cardiologist.  Please see their note for interpretation of EKG.    RADIOLOGY:   Non-plain film images such as CT, Ultrasound and MRI are read by the radiologist. Plain radiographic images are visualized and preliminarily interpreted by the ED Provider with the below findings:    Chest x-ray viewed by myself and interpreted by radiology shows no acute cardiopulmonary abnormality.    CT abdomen pelvis with IV contrast shows cholelithiasis without any evidence of acute pancreatitis.    Ultrasound right upper quadrant showing cholelithiasis without evidence of acute cholecystitis.    Interpretation per the Radiologist below, if available at the time of this note:    CT ABDOMEN PELVIS W IV CONTRAST Additional Contrast? None   Final Result   Cholelithiasis.  Colonic diverticulosis without acute diverticulitis.         US ABDOMEN LIMITED Specify organ? LIVER, GALLBLADDER, PANCREAS   Final Result   Cholelithiasis without other findings to suggest acute cholecystitis.         XR CHEST PORTABLE   Final Result   No acute cardiopulmonary findings         NM Cardiac Stress Test Nuclear Imaging    (Results Pending)     No results found.    No results  found.    PROCEDURES   Unless otherwise noted below, none     Procedures    CRITICAL CARE TIME (.cctime)   Critical Care  There was a high probability of life-threatening clinical deterioration in the patient's condition requiring my urgent intervention.  Total critical care time with the patient was 35 minutes excluding separately reportable procedures.  Critical care required due to patients rotation of chest tightness, epigastric pain, belching, circumferential upper abdominal tightness with elevating troponins, elevated LFTs,  elevated lipase.  Patient differential does include pain of cardiac origin as well as gallbladder induced pancreatitis.      PAST MEDICAL HISTORY      has a past medical history of Breast cancer (HCC), Cancer (HCC) (08/24/12), Cancer (HCC), and Gastric reflux.     EMERGENCY DEPARTMENT COURSE and DIFFERENTIAL DIAGNOSIS/MDM:   Vitals:    Vitals:    05/30/21 2102 05/30/21 2215 05/31/21 0451 05/31/21 0748   BP: (!) 159/80 (!) 156/78 (!) 155/77 (!) 152/75   Pulse: 70 62 60 58   Resp: 15 16 18 18    Temp:  98 F (36.7 C) 97.6 F (36.4 C) 98 F (36.7 C)   TempSrc:  Oral Oral Oral   SpO2: 98% 98% 97% 98%   Weight:  156 lb 8 oz (71 kg)     Height:  5' 8.5" (1.74 m)         Patient was given the following medications:  Medications   sodium chloride flush 0.9 % injection 5-40 mL (5 mLs IntraVENous Not Given 05/31/21 0727)   sodium chloride flush 0.9 % injection 5-40 mL (has no administration in time range)   0.9 % sodium chloride infusion (has no administration in time range)   enoxaparin (LOVENOX) injection 40 mg (has no administration in time range)   ondansetron (ZOFRAN-ODT) disintegrating tablet 4 mg ( Oral See Alternative 05/31/21 0451)     Or   ondansetron (ZOFRAN) injection 4 mg (4 mg IntraVENous Given 05/31/21 0451)   polyethylene glycol (GLYCOLAX) packet 17 g (has no administration in time range)   acetaminophen (TYLENOL) tablet 650 mg (650 mg Oral Given 05/31/21 0451)     Or   acetaminophen  (TYLENOL) suppository 650 mg ( Rectal See Alternative 05/31/21 0451)   0.9 % sodium chloride infusion (0 mL/hr IntraVENous Stopped 05/31/21 0913)   famotidine (PEPCID) 20 mg in sodium chloride (PF) 0.9 % 10 mL injection (20 mg IntraVENous Given 05/31/21 0834)   aluminum & magnesium hydroxide-simethicone (MAALOX) 200-200-20 MG/5ML suspension 30 mL (has no administration in time range)   hydrALAZINE (APRESOLINE) injection 10 mg (has no administration in time range)   morphine (PF) injection 0.5 mg (has no administration in time range)     Or   morphine (PF) injection 1 mg (has no administration in time range)   melatonin tablet 6 mg (6 mg Oral Given 05/31/21 0023)   aspirin EC tablet 81 mg (has no administration in time range)   regadenoson (LEXISCAN) injection 0.4 mg (has no administration in time range)   ondansetron (ZOFRAN) injection 4 mg (4 mg IntraVENous Given 05/30/21 1809)   0.9 % sodium chloride bolus (0 mLs IntraVENous Stopped 05/30/21 1856)   aspirin chewable tablet 243 mg (243 mg Oral Given 05/30/21 1745)   aluminum & magnesium hydroxide-simethicone (MAALOX) 200-200-20 MG/5ML suspension 30 mL (30 mLs Oral Given 05/30/21 1809)   famotidine (PEPCID) 20 mg in sodium chloride (PF) 0.9 % 10 mL injection (20 mg IntraVENous Given 05/30/21 1810)   ketorolac (TORADOL) injection 30 mg (30 mg IntraVENous Given 05/30/21 1905)   iopamidol (ISOVUE-370) 76 % injection 75 mL (75 mLs IntraVENous Given 05/30/21 1826)   nitroglycerin (NITRO-BID) 2 % ointment 1 inch (1 inch Topical Given 05/30/21 2009)   technetium tetrofosmin (Tc-MYOVIEW) injection 11 millicurie (11 millicuries IntraVENous Given 05/31/21 1000)             Is this patient to be included in the SEP-1 Core Measure due to severe sepsis or  septic shock?   No   Exclusion criteria - the patient is NOT to be included for SEP-1 Core Measure due to:  Infection is not suspected    Chronic Conditions affecting care: Breast cancer, GERD   has a past medical history of Breast cancer  (HCC), Cancer (HCC) (08/24/12), Cancer (HCC), and Gastric reflux.    CONSULTS: (Who and What was discussed)  IP CONSULT TO HOSPITALIST  IP CONSULT TO CARDIOLOGY  IP CONSULT TO GI  IP CONSULT TO GENERAL SURGERY      Social Determinants Significantly Affecting Health : None    Records Reviewed (External and Source) none    CC/HPI Summary, DDx, ED Course, and Reassessment:     This patient presenting with complaint of chest pressure lower chest and epigastrium beginning at 3 PM with subsequent nausea and vomiting 10-14 times.  Pain went to her back and around her upper abdomen and lower chest.  Heart score is 4.  She took aspirin 81 mg at home.  I gave her additional 3 aspirin in the ED.  Patient was pain-free upon ED arrival.  While in the ED she had a return of her discomfort.  She was given Toradol 30 mg IV x1 with minimal effect on the pain.  I reassessed the patient's several times.  Last assessment at 7:20 PM.  BP is 205/87.  She is complaining of the lower chest tightness at a 3/10.  Discussed with attending physician.  At this time the patient will be started on Nitropaste 1 inch.    Since initial troponin less than 6 and repeat troponin results at 9.  Cannot exclude an emerging cardiac problem.    Patient's lipase is elevated at 218.  Ultrasound of right upper quadrant and CT scan abdomen pelvis with IV Obtain showing no obvious pancreatic abnormality.  Cholelithiasis noted.  Cannot exclude gallbladder induced pancreatitis.  At 1 point during the patient's ED visit the patient complaining of some belching and indigestion-like symptoms.  I did order Maalox 30 mL.  Minimal effect.    I discussed with the patient and husband both agreeable to admission for further evaluation from cardiology, GI and general surgery.    Disposition Considerations (tests considered but not done, Admit vs D/C, Shared Decision Making, Pt Expectation of Test or Tx.):     This patient be admitted.  I discussed case with attending  physician who is in agreement.  This patient arriving home at 2:30 PM from work.  At 3 PM she noted some mild indigestion and belching and lower chest tightness/pain, epigastric pain and circumferential pain around the lower chest and upper abdomen.  Patient with known cholelithiasis.  Ultrasound and CT scan obtained.  Patient is elevated lipase at 218 and mild elevation in LFTs noting ALT at 44 and AST at 103.  The patient with heart score 4.  No known cardiac disease.  The patient's initial troponin less than 6.  Repeat troponin 9.  Patient with indigestion and belching with clear in ED.  Patient a bit hypertensive at 205/87.  Patient took aspirin 81 mg at home.  I provided 3 additional for a total of 324 mg of aspirin.  At approximately 7:30 PM order for Nitropaste 1 inch.    Patient be admitted with diagnosis chest pain, abdominal pain, possible gallbladder pancreatitis, elevated LFTs and treated hypertension.    At 7:50 PM I sent [employer] note to hospitalist.      I am the Primary Clinician of Record.  FINAL IMPRESSION      1. Chest pain, unspecified type    2. Abdominal pain, epigastric    3. Acute pancreatitis, unspecified complication status, unspecified pancreatitis type    4. Calculus of gallbladder without cholecystitis without obstruction    5. Elevated LFTs    6. Hypertension, unspecified type          DISPOSITION/PLAN     DISPOSITION Admitted 05/30/2021 08:20:58 PM      PATIENT REFERRED TO:  No follow-up provider specified.    DISCHARGE MEDICATIONS:  Current Discharge Medication List          DISCONTINUED MEDICATIONS:  Current Discharge Medication List                 (Please note that portions of this note were completed with a voice recognition program.  Efforts were made to edit the dictations but occasionally words are mis-transcribed.)    Ike Bene, PA-C (electronically signed)  ]      Ike Bene, PA-C  05/30/21 2352       Ike Bene, PA-C  05/31/21 1013

## 2021-05-31 ENCOUNTER — Inpatient Hospital Stay: Admit: 2021-05-31 | Payer: PRIVATE HEALTH INSURANCE | Primary: Family Medicine

## 2021-05-31 LAB — COMPREHENSIVE METABOLIC PANEL W/ REFLEX TO MG FOR LOW K
ALT: 352 U/L — ABNORMAL HIGH (ref 10–40)
AST: 327 U/L — ABNORMAL HIGH (ref 15–37)
Albumin/Globulin Ratio: 1.5 (ref 1.1–2.2)
Albumin: 3.7 g/dL (ref 3.4–5.0)
Alkaline Phosphatase: 101 U/L (ref 40–129)
Anion Gap: 8 (ref 3–16)
BUN: 17 mg/dL (ref 7–20)
CO2: 25 mmol/L (ref 21–32)
Calcium: 8.8 mg/dL (ref 8.3–10.6)
Chloride: 107 mmol/L (ref 99–110)
Creatinine: 0.8 mg/dL (ref 0.6–1.2)
Est, Glom Filt Rate: >60 (ref >60)
Glucose: 107 mg/dL — ABNORMAL HIGH (ref 70–99)
Potassium reflex Magnesium: 4.3 mmol/L (ref 3.5–5.1)
Sodium: 140 mmol/L (ref 136–145)
Total Bilirubin: 0.5 mg/dL (ref 0.0–1.0)
Total Protein: 6.1 g/dL — ABNORMAL LOW (ref 6.4–8.2)

## 2021-05-31 LAB — EKG 12-LEAD
Atrial Rate: 88 {beats}/min
Atrial Rate: 64 {beats}/min
P Axis: 60 degrees
P Axis: 57 degrees
P-R Interval: 116 ms
P-R Interval: 136 ms
Q-T Interval: 358 ms
Q-T Interval: 406 ms
QRS Duration: 76 ms
QRS Duration: 72 ms
QTc Calculation (Bazett): 433 ms
QTc Calculation (Bazett): 418 ms
R Axis: 48 degrees
R Axis: 34 degrees
T Axis: 47 degrees
T Axis: 47 degrees
Ventricular Rate: 88 {beats}/min
Ventricular Rate: 64 {beats}/min

## 2021-05-31 LAB — CBC WITH AUTO DIFFERENTIAL
Basophils %: 1.9 %
Basophils Absolute: 0 10*3/uL (ref 0.0–0.2)
Eosinophils %: 3.5 %
Eosinophils Absolute: 0.1 10*3/uL (ref 0.0–0.6)
Hematocrit: 34.1 % — ABNORMAL LOW (ref 36.0–48.0)
Hemoglobin: 11.5 g/dL — ABNORMAL LOW (ref 12.0–16.0)
Lymphocytes %: 34.6 %
Lymphocytes Absolute: 0.9 10*3/uL — ABNORMAL LOW (ref 1.0–5.1)
MCH: 28.6 pg (ref 26.0–34.0)
MCHC: 33.8 g/dL (ref 31.0–36.0)
MCV: 84.7 fL (ref 80.0–100.0)
MPV: 8.3 fL (ref 5.0–10.5)
Monocytes %: 12.4 %
Monocytes Absolute: 0.3 10*3/uL (ref 0.0–1.3)
Neutrophils %: 47.6 %
Neutrophils Absolute: 1.2 10*3/uL — ABNORMAL LOW (ref 1.7–7.7)
Platelets: 194 10*3/uL (ref 135–450)
RBC: 4.02 M/uL (ref 4.00–5.20)
RDW: 13.2 % (ref 12.4–15.4)
WBC: 2.6 10*3/uL — ABNORMAL LOW (ref 4.0–11.0)

## 2021-05-31 LAB — HEPATITIS PANEL, ACUTE
Hep A IgM: NONREACTIVE
Hep B Core Ab, IgM: NONREACTIVE
Hep B S Ag Interp: NONREACTIVE
Hep C Ab Interp: NONREACTIVE

## 2021-05-31 LAB — LIPID PANEL
Cholesterol, Total: 137 mg/dL (ref 0–199)
HDL: 50 mg/dL (ref 40–60)
LDL Calculated: 74 mg/dL (ref <100)
Triglycerides: 63 mg/dL (ref 0–150)
VLDL Cholesterol Calculated: 13 mg/dL

## 2021-05-31 LAB — NM MYOCARDIAL SPECT REST EXERCISE OR RX: Left Ventricular Ejection Fraction: 69

## 2021-05-31 LAB — TROPONIN
Troponin, High Sensitivity: 23 ng/L — ABNORMAL HIGH (ref 0–14)
Troponin, High Sensitivity: 17 ng/L — ABNORMAL HIGH (ref 0–14)

## 2021-05-31 LAB — POCT GLUCOSE: POC Glucose: 108 mg/dl — ABNORMAL HIGH (ref 70–99)

## 2021-05-31 MED ORDER — TECHNETIUM TC 99M TETROFOSMIN IV KIT
Freq: Once | INTRAVENOUS | Status: AC | PRN
Start: 2021-05-31 — End: 2021-05-31
  Administered 2021-05-31: 14:00:00 11 via INTRAVENOUS

## 2021-05-31 MED ORDER — ONDANSETRON HCL 4 MG/2ML IJ SOLN
4 MG/2ML | Freq: Four times a day (QID) | INTRAMUSCULAR | Status: AC | PRN
Start: 2021-05-31 — End: 2021-05-31
  Administered 2021-05-31: 09:00:00 4 mg via INTRAVENOUS

## 2021-05-31 MED ORDER — SODIUM CHLORIDE 0.9 % IV SOLN
0.9 % | INTRAVENOUS | Status: AC | PRN
Start: 2021-05-31 — End: 2021-05-31

## 2021-05-31 MED ORDER — ENOXAPARIN SODIUM 40 MG/0.4ML IJ SOSY
40 MG/0.4ML | Freq: Every day | INTRAMUSCULAR | Status: AC
Start: 2021-05-31 — End: 2021-05-31

## 2021-05-31 MED ORDER — ONDANSETRON 4 MG PO TBDP
4 MG | Freq: Three times a day (TID) | ORAL | Status: AC | PRN
Start: 2021-05-31 — End: 2021-05-31

## 2021-05-31 MED ORDER — MORPHINE SULFATE (PF) 2 MG/ML IV SOLN
2 MG/ML | INTRAVENOUS | Status: AC | PRN
Start: 2021-05-31 — End: 2021-05-31

## 2021-05-31 MED ORDER — SODIUM CHLORIDE 0.9 % IV SOLN
0.9 % | INTRAVENOUS | Status: AC
Start: 2021-05-31 — End: 2021-05-31
  Administered 2021-05-31: 04:00:00 via INTRAVENOUS

## 2021-05-31 MED ORDER — ASPIRIN 81 MG PO TBEC
81 MG | Freq: Every day | ORAL | Status: DC
Start: 2021-05-31 — End: 2021-05-31
  Administered 2021-05-31: 17:00:00 81 mg via ORAL

## 2021-05-31 MED ORDER — POLYETHYLENE GLYCOL 3350 17 G PO PACK
17 g | Freq: Every day | ORAL | Status: AC | PRN
Start: 2021-05-31 — End: 2021-05-31

## 2021-05-31 MED ORDER — ACETAMINOPHEN 325 MG PO TABS
325 MG | Freq: Four times a day (QID) | ORAL | Status: DC | PRN
Start: 2021-05-31 — End: 2021-05-31
  Administered 2021-05-31: 09:00:00 650 mg via ORAL

## 2021-05-31 MED ORDER — ACETAMINOPHEN 650 MG RE SUPP
650 | Freq: Four times a day (QID) | RECTAL | Status: DC | PRN
Start: 2021-05-31 — End: 2021-05-31

## 2021-05-31 MED ORDER — REGADENOSON 0.4 MG/5ML IV SOLN
0.4 MG/5ML | Freq: Once | INTRAVENOUS | Status: DC | PRN
Start: 2021-05-31 — End: 2021-05-31

## 2021-05-31 MED ORDER — HYDRALAZINE HCL 20 MG/ML IJ SOLN
20 MG/ML | Freq: Four times a day (QID) | INTRAMUSCULAR | Status: DC | PRN
Start: 2021-05-31 — End: 2021-05-31

## 2021-05-31 MED ORDER — MORPHINE SULFATE (PF) 2 MG/ML IV SOLN
2 | INTRAVENOUS | Status: DC | PRN
Start: 2021-05-31 — End: 2021-05-31

## 2021-05-31 MED ORDER — ALUM & MAG HYDROXIDE-SIMETH 200-200-20 MG/5ML PO SUSP
200-200-20 MG/5ML | Freq: Four times a day (QID) | ORAL | Status: AC | PRN
Start: 2021-05-31 — End: 2021-05-31

## 2021-05-31 MED ORDER — NORMAL SALINE FLUSH 0.9 % IV SOLN
0.9 % | INTRAVENOUS | Status: AC | PRN
Start: 2021-05-31 — End: 2021-05-31

## 2021-05-31 MED ORDER — TECHNETIUM TC 99M TETROFOSMIN IV KIT
Freq: Once | INTRAVENOUS | Status: AC | PRN
Start: 2021-05-31 — End: 2021-05-31
  Administered 2021-05-31: 15:00:00 30 via INTRAVENOUS

## 2021-05-31 MED ORDER — FAMOTIDINE (PF) 20 MG/2ML IV SOLN
202 MG/2ML | Freq: Two times a day (BID) | INTRAVENOUS | Status: AC
Start: 2021-05-31 — End: 2021-05-31
  Administered 2021-05-31 (×2): 20 mg via INTRAVENOUS

## 2021-05-31 MED ORDER — NORMAL SALINE FLUSH 0.9 % IV SOLN
0.9 % | Freq: Two times a day (BID) | INTRAVENOUS | Status: AC
Start: 2021-05-31 — End: 2021-05-31
  Administered 2021-05-31: 03:00:00 10 mL via INTRAVENOUS

## 2021-05-31 MED ORDER — MELATONIN 3 MG PO TABS
3 MG | Freq: Every evening | ORAL | Status: DC | PRN
Start: 2021-05-31 — End: 2021-05-31
  Administered 2021-05-31: 04:00:00 6 mg via ORAL

## 2021-05-31 MED FILL — MAPAP 325 MG PO TABS: 325 MG | ORAL | Qty: 2

## 2021-05-31 MED FILL — FAMOTIDINE (PF) 20 MG/2ML IV SOLN: 20 MG/2ML | INTRAVENOUS | Qty: 2

## 2021-05-31 MED FILL — MELATONIN 3 MG PO TABS: 3 MG | ORAL | Qty: 2

## 2021-05-31 MED FILL — ASPIRIN EC 81 MG PO TBEC: 81 MG | ORAL | Qty: 1

## 2021-05-31 MED FILL — ONDANSETRON HCL 4 MG/2ML IJ SOLN: 4 MG/2ML | INTRAMUSCULAR | Qty: 2

## 2021-05-31 MED FILL — NITRO-BID 2 % TD OINT: 2 % | TRANSDERMAL | Qty: 1

## 2021-05-31 NOTE — Progress Notes (Signed)
Patient arrived to stress lab for stress test.  Patient was educated on procedure, all questions answered, and consent verified/obtained.

## 2021-05-31 NOTE — Progress Notes (Signed)
Physician Progress Note      PATIENT:               Vicki Hurst, Vicki Hurst  CSN #:                  [id number]  DOB:                       December 09, 1950  ADMIT DATE:       05/30/2021 4:09 PM  DISCH DATE:        05/31/2021 5:15 PM  RESPONDING  PROVIDER #:        Fritzi Mandes MD          QUERY TEXT:    Pt admitted with Chest pain.  Noted documentation of acute pancreatitis in the   h/p.  CT scan shows similar findings without obvious signs of pancreatitis.     If possible, please document in progress notes and discharge summary:    The medical record reflects the following:  Risk Factors: age, abnormal LFTs and gallstones. CT A/P showing cholelithiasis   without gallbladder inflammation.  Clinical Indicators: abd pain, nausea, Acute pancreatitis per h/p, lipase 218  Treatment: CT abd, GI/GS consults, NPO, IVF    Thank you,  Lesley Hudgel RN,BSN,CCDS,CRCR  Options provided:  -- Acute pancreatitis confirmed as evidenced by, please include supporting   evidence.  -- Acute pancreatitis ruled out  -- Other - I will add my own diagnosis  -- Disagree - Not applicable / Not valid  -- Disagree - Clinically unable to determine / Unknown  -- Refer to Clinical Documentation Reviewer    PROVIDER RESPONSE TEXT:    The diagnosis of acute pancreatitis was ruled out after study.    Query created by: Staci Righter on 06/04/2021 10:07 AM      Electronically signed by:  Fritzi Mandes MD 06/04/2021 5:28 PM

## 2021-05-31 NOTE — Discharge Instructions (Signed)
Low fat diet

## 2021-05-31 NOTE — Progress Notes (Signed)
A stress test was completed on this patient as ordered, okayed per Dr. Lyndel Safe for pt to do GXT test per pt request. The patient tolerated the procedure well, denied chest pain, all symptoms resolved in recovery.  Awaiting stress imaging at this time.

## 2021-05-31 NOTE — ACP (Advance Care Planning) (Signed)
Advance Care Planning     General Advance Care Planning (ACP) Conversation    Date of Conversation: 05/30/2021  Conducted with: Patient with Decision Making Capacity    Healthcare Decision Maker:  No healthcare decision makers have been documented.  Click here to complete Clinical research associate of the Environmental health practitioner Relationship (ie "Primary")   Today we documented Decision Maker(s) consistent with Legal Next of Kin hierarchy.    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

## 2021-05-31 NOTE — Plan of Care (Signed)
Problem: Discharge Planning  Goal: Discharge to home or other facility with appropriate resources  Outcome: Progressing  Flowsheets (Taken 05/30/2021 2231 by Timoteo Expose, RN)  Discharge to home or other facility with appropriate resources:   Identify barriers to discharge with patient and caregiver   Identify discharge learning needs (meds, wound care, etc)   Refer to discharge planning if patient needs post-hospital services based on physician order or complex needs related to functional status, cognitive ability or social support system     Problem: Pain  Goal: Verbalizes/displays adequate comfort level or baseline comfort level  Outcome: Progressing

## 2021-05-31 NOTE — Care Coordination-Inpatient (Signed)
Case Management Assessment  Initial Evaluation    Date/Time of Evaluation: 05/31/2021 3:18 PM  Assessment Completed by: Gwynneth Albright, RN    If patient is discharged prior to next notation, then this note serves as note for discharge by case management.    Patient Name: Vicki Hurst                   Date of Birth: [birth year]  Diagnosis: Abdominal pain, epigastric [R10.13]  Chest pain [R07.9]  Elevated LFTs [R79.89]  Calculus of gallbladder without cholecystitis without obstruction [K80.20]  Chest pain, unspecified type [R07.9]  Acute pancreatitis, unspecified complication status, unspecified pancreatitis type [K85.90]  Hypertension, unspecified type [I10]                   Date / Time: 05/30/2021  4:09 PM    Patient Admission Status: Inpatient   Readmission Risk (Low < 19, Mod (19-27), High > 27): Readmission Risk Score: 7.9    Current PCP: Bryson Ha, MD  PCP verified by CM?      Chart Reviewed: Yes      History Provided by:    Patient Orientation:      Patient Cognition:      Hospitalization in the last 30 days (Readmission):  No    If yes, Readmission Assessment in CM Navigator will be completed.    Advance Directives:      Code Status: Full Code   Patient's Primary Decision Maker is: Legal Next of Kin      Discharge Planning:    Patient lives with: Spouse/Significant Other Type of Home: House  Primary Care Giver:    Patient Support Systems include: Spouse/Significant Other   Current Financial resources:    Current community resources:    Current services prior to admission: None            Current DME:              Type of Home Care services:       ADLS  Prior functional level:    Current functional level:      PT AM-PAC:   /24  OT AM-PAC:   /24    Family can provide assistance at DC:    Would you like Case Management to discuss the discharge plan with any other family members/significant others, and if so, who?    Plans to Return to Present Housing:    Other Identified Issues/Barriers to RETURNING to current  housing: none  Potential Assistance needed at discharge: N/A            Potential DME:    Patient expects to discharge to: [facility] for transportation at discharge:      Financial    Payor: Major / Plan: MEDICARE PART A AND B / Product Type: *No Product type* /     Does insurance require precert for SNF: Yes    Potential assistance Purchasing Medications:    Meds-to-Beds request:        [facility], [place]  [place] [address]  Phone: [phone] Fax: [phone]      Notes:    Factors facilitating achievement of predicted outcomes: Family support, Motivated, Cooperative, Pleasant, and Sense of humor    Barriers to discharge: none    Additional Case Management Notes: Reviewed chart and met with pt. Pt is IPTA and  states she is to be DC'd today. Pt will DC with spouse and denies any DC needs. Not following    The Plan for Transition of Care is related to the following treatment goals of Abdominal pain, epigastric [R10.13]  Chest pain [R07.9]  Elevated LFTs [R79.89]  Calculus of gallbladder without cholecystitis without obstruction [K80.20]  Chest pain, unspecified type [R07.9]  Acute pancreatitis, unspecified complication status, unspecified pancreatitis type [K85.90]  Hypertension, unspecified type [I37]    IF APPLICABLE: The Patient and/or patient representative Vicki Hurst and her family were provided with a choice of provider and agrees with the discharge plan. Freedom of choice list with basic dialogue that supports the patient's individualized plan of care/goals and shares the quality data associated with the providers was provided to:     Patient Representative Name:       The Patient and/or Patient Representative Agree with the Discharge Plan?      Gwynneth Albright, RN  Case Management Department  Ph: [phone] Fax: [phone]

## 2021-05-31 NOTE — Progress Notes (Signed)
No Prescriptions and discharge instructions given. Pt verbalized understanding denies any questions/ needs at this time. Pt walked off floor to vehicle for discharge home

## 2021-05-31 NOTE — Plan of Care (Signed)
Problem: Discharge Planning  Goal: Discharge to home or other facility with appropriate resources  05/31/2021 1638 by Storm Frisk, RN  Outcome: Adequate for Discharge  05/31/2021 0805 by Storm Frisk, RN  Outcome: Progressing  Flowsheets (Taken 05/30/2021 2231 by Timoteo Expose, RN)  Discharge to home or other facility with appropriate resources:   Identify barriers to discharge with patient and caregiver   Identify discharge learning needs (meds, wound care, etc)   Refer to discharge planning if patient needs post-hospital services based on physician order or complex needs related to functional status, cognitive ability or social support system     Problem: Pain  Goal: Verbalizes/displays adequate comfort level or baseline comfort level  05/31/2021 1638 by Storm Frisk, RN  Outcome: Adequate for Discharge  05/31/2021 0805 by Storm Frisk, RN  Outcome: Progressing

## 2021-05-31 NOTE — Consults (Signed)
Consultation Note    Patient Name: Vicki Hurst  DOB: [birth year]  Age: 71 y.o.     Admitting Physician: Cameron Sprang, MD   Date of Admission: 05/30/2021  4:09 PM   Primary Care Physician: Bryson Ha, MD        Vicki Hurst is being seen at the request of Cameron Sprang, MD for abdominal pain.    History of Present Illness:  71 year old female with history of breast cancer, GERD. Patient presents with sudden onset of chest pain/epigastric. GI has been consulted for abnormal LFTs and gallstones.     Yesterday the patient developed a sudden onset of right upper quadrant pain with nausea.    CT A/P showing cholelithiasis without gallbladder inflammation and colonic diverticulosis without acute diverticulitis  [place]: CBD 3 mm, cholelithiasis    Troponin noted to be 23  Total bili wnl. ALT 352, AST 327-- these are trending up from yesterday. Total bili and alk phos wnl  Lipase 218    GI History:  No history of colonoscopy, did cologard twice it was negative     Past Medical History:  Past Medical History:   Diagnosis Date    Breast cancer ([place])     Cancer ([place]) 08/24/12    breast cancer    Cancer ([place])     Breast    Gastric reflux         Past Surgical History:  Past Surgical History:   Procedure Laterality Date    EYE SURGERY      TUNNELED VENOUS PORT PLACEMENT          Historical Medications:  Prior to Visit Medications    Medication Sig Taking? Authorizing Provider   alum Hydroxide-Mag Carbonate (GAVISCON EXTRA STRENGTH) 160-105 MG CHEW Take by mouth Yes Historical Provider, MD   vitamin D (CHOLECALCIFEROL) 50 MCG (2000 UT) TABS tablet Take 1 tablet by mouth daily  Historical Provider, MD        Hospital Medications:  Current Facility-Administered Medications: melatonin tablet 6 mg, 6 mg, Oral, Nightly PRN  aspirin EC tablet 81 mg, 81 mg, Oral, Daily  regadenoson (LEXISCAN) injection 0.4 mg, 0.4 mg, IntraVENous, ONCE PRN  sodium chloride flush 0.9 % injection 5-40 mL, 5-40 mL, IntraVENous, 2 times per  day  sodium chloride flush 0.9 % injection 5-40 mL, 5-40 mL, IntraVENous, PRN  0.9 % sodium chloride infusion, , IntraVENous, PRN  enoxaparin (LOVENOX) injection 40 mg, 40 mg, SubCUTAneous, Daily  ondansetron (ZOFRAN-ODT) disintegrating tablet 4 mg, 4 mg, Oral, Q8H PRN **OR** ondansetron (ZOFRAN) injection 4 mg, 4 mg, IntraVENous, Q6H PRN  polyethylene glycol (GLYCOLAX) packet 17 g, 17 g, Oral, Daily PRN  acetaminophen (TYLENOL) tablet 650 mg, 650 mg, Oral, Q6H PRN **OR** acetaminophen (TYLENOL) suppository 650 mg, 650 mg, Rectal, Q6H PRN  famotidine (PEPCID) 20 mg in sodium chloride (PF) 0.9 % 10 mL injection, 20 mg, IntraVENous, BID  aluminum & magnesium hydroxide-simethicone (MAALOX) 200-200-20 MG/5ML suspension 30 mL, 30 mL, Oral, Q6H PRN  hydrALAZINE (APRESOLINE) injection 10 mg, 10 mg, IntraVENous, Q6H PRN  morphine (PF) injection 0.5 mg, 0.5 mg, IntraVENous, Q2H PRN **OR** morphine (PF) injection 1 mg, 1 mg, IntraVENous, Q4H PRN     Social History:   Social History       Tobacco History       Smoking Status  Never      Smokeless Tobacco Use  Never  Alcohol History       Alcohol Use Status  No              Drug Use       Drug Use Status  No              Sexual Activity       Sexually Active  Yes Partners  Female                     Family History:  History reviewed. No pertinent family history.     Allergies:  Allergies   Allergen Reactions    Sulfa Antibiotics Swelling    Keflex [Cephalexin] Swelling    Macrobid [Nitrofurantoin Monohyd Macro]     Nickel     Sulfa Antibiotics Hives        ROS:   General: No fever or weight change  Hematologic: No unexpected submucosal bleeding or bruising  HEENT: No sore throat or facial pain  Respiratory: No cough or dyspnea  Cardiovascular: No angina or dependent edema  Gastrointestinal: See HPI  Musculoskeletal: No usual joint pain or stiffness  Skin: No skin eruptions or changing lesions  Neurologic: No focal weakness or numbness  Psychiatric: No anxiety or sleep  disturbance    Physical Exam:  Vital Signs:   Vitals:    05/31/21 1142   BP: (!) 163/72   Pulse: 77   Resp: 18   Temp: 98.1 F (36.7 C)   SpO2: 98%       General: Well-nourished, well-developed  HEENT: Sclera anicteric, mucosal membranes moist  Cardiovascular: Regular rate and rhythm.  No murmurs.  Respiratory: Respirations nonlabored, no crepitus  GI: Abdomen nondistended, soft, and tender.  Normal active bowel sounds.  No masses palpable.   Rectal: Deferred  Musculoskeletal: No pitting edema of the lower legs.  Neurological: Gross memory appears intact.  Patient is alert and oriented      Recent Imaging:   NM Cardiac Stress Test Nuclear Imaging  Cardiac Perfusion Imaging     Demographics      Patient Name       Vicki Hurst      Date of Study      05/31/2021         Gender              Female      Patient Number     [id number]         Date of Birth       [birth year]      Visit Number       [id number]          Age                 82 year(s)      Accession Number   [id number]         Room Number         0209      Corporate ID       [id number]            NM Technician       Vicki Hurst,  [place], CNMT      Nurse              Vicki Hurst     Interpreting        Vicki Hurst.                                         Physician           Vicki Greathouse, MD      Ordering Physician Baruch Goldmann,                      MD, [facility]      The procedure was explained in detail to the patient. Risks,   complications and alternative treatments were reviewed. Written consent   was obtained.     Procedure  Procedure Type:      Nuclear Stress Test:Exercise      Study location: [facility] - Nuclear Medicine      Indications: Angina Pectoris.                   Hospital Status: Inpatient.     Height: 68 inches Weight: 156 pounds     Conclusions      Summary   Fair exercise capacity. No ischemic ECG changes. Occasional PVCs.   Normal systolic function. Left  ventricular ejection fraction of 69%. Normal   wall motion.   There is normal isotope uptake at stress and rest. There is no evidence of   myocardial ischemia or scar.   Overall findings represent a low risk scan.     Stress Protocols      Resting HR:70 bpm        Resting BP:174/81 mmHg      Pre-stress physical exam: Patient denied chest pain.     Stress Protocol:Exercise - Bruce     Peak HR:162 bpm                          HR/BP product:34182   Peak BP:211/73 mmHg                      Max exercise: 4.6 METS   Predicted HR: 151 bpm   % of predicted HR: 107                   Exercise effort:Good   Test duration:6 min and 6 sec   Reason for termination:Target heart rate   Perceived exertion:3      Symptoms   Patient met target heart rate in stage 1, stage held then   hill/speed lowered due to patient exceeding 100% max heart   rate. Ectopy noted, patient denied chest pain or palpitations,   complained of being winded and leg pain, all symptoms resolved   with rest.      Complications   Procedure complication was none.      Imaging Protocols      - One Day      Rest                          Stress      Isotope:Myoview/Tetrofosmin   Isotope: Myoview/Tetrofosmin   Isotope dose:11 mCi           Isotope dose:30 mCi   Administration Route:I.V.  Administration Route:I.V.   Date:05/31/2021 10:00         Date:05/31/2021 10:45                                    Technique:      Gated     Imaging Results      Applied corrections      - Attenuation correction   applied        Stress ejection     Ejection fraction:69 %     EDV :65 ml     ESV :20 ml     Stroke volume :45 ml     LV mass :108 gr     Medical History      Additional Medical History      HX breast cancer and DVT      Signatures      ------------------------------------------------------------------   Electronically signed by Vicki Greathouse, MD (Interpreting   physician) on 05/31/2021 at 12:24   ------------------------------------------------------------------           Labs:   Recent Labs     05/30/21  1716 05/31/21  0627   HGB 13.3 11.5*   WBC 5.2 2.6*   PROT 7.7 6.1*   LABALBU 4.4 3.7   ALKPHOS 102 101   ALT 44* 352*   AST 103* 327*   BILITOT 0.5 0.5   BILIDIR <0.2  --    IBILI see below  --         Assessment:71 year old female with history of breast cancer, GERD. Patient presents with sudden onset of chest pain/epigastric. GI has been consulted for abnormal LFTs and gallstones. CT A/P showing cholelithiasis without gallbladder inflamation and colonic diverticulosis without acute diverticulitis. US abdomen showing CBD of 3 mm  Troponin noted to be 23  Total bili wnl. ALT 352, AST 327-- these are trending up from yesterday. Total bili and alk phos wnl  Lipase 218    Noted cardiology work up in progress with echo and stress test    Plan:  The patient will be discharged with follow up with surgery for cholecystectomy  Pending acute hep panel  Trend LFTs  Gi will sign off for now, please reconsult at any time.      Laurell Josephs, MD    [facility]    [phone]. Also available via [employer]

## 2021-05-31 NOTE — Other (Signed)
Stress Lab

## 2021-05-31 NOTE — Progress Notes (Signed)
Notified [facility] pt to stress lab.

## 2021-05-31 NOTE — Progress Notes (Signed)
Patient admitted to room 209 from ER. Patient oriented to room, call light, bed rails, phone, lights and bathroom. Patient instructed about the schedule of the day including: vital sign frequency, lab draws, possible tests, frequency of MD and staff rounds, daily weights, I &O's and prescribed diet. Bed alarm deferred patient low fall riskTelemetry box in place, patient aware of placement and reason. Bed locked, in lowest position, side rails up 2/4, call light within reach.    Recliner Assessment:     Patient is able to demonstrate the ability to move from a reclining position to an upright position within the recliner.     Bedside Mobility Assessment Tool (BMAT):     Assessment Level 1- Sit and Shake    1. From a semi-reclined position, ask patient to sit up and rotate to a seated position at the side of the bed. Can use the bedrail.    2. Ask patient to reach out and grab your hand and shake making sure patient reaches across his/her midline.   Pass- Patient is able to come to a seated position, maintain core strength. Maintains seated balance while reaching across midline. Move on to Assessment Level 2.     Assessment Level 2- Stretch and Point   1. With patient in seated position at the side of the bed, have patient place both feet on the floor (or stool) with knees no higher than hips.    2. Ask patient to stretch one leg and straighten the knee, then bend the ankle/flex and point the toes. If appropriate, repeat with the other leg.   Pass- Patient is able to demonstrate appropriate quad strength on intended weight bearing limb(s). Move onto Assessment Level 3.     Assessment Level 3- Stand   1. Ask patient to elevate off the bed or chair (seated to standing) using an assistive device (cane, bedrail).    2. Patient should be able to raise buttocks off be and hold for a count of five. May repeat once.   Pass- Patient maintains standing stability for at least 5 seconds, proceed to assessment level  4.    Assessment Level 4- Walk   1. Ask patient to march in place at bedside.    2. Then ask patient to advance step and return each foot. Some medical conditions may render a patient from stepping backwards, use your best clinical judgement.   Pass- Patient demonstrates balance while shifting weight and ability to step, takes independent steps, does not use assistive device patient is MOBILITY LEVEL 4.      Mobility Level- 4        4 Eyes Skin Assessment     The patient is being assess for   Admission    I agree that 2 RN's have performed a thorough Head to Toe Skin Assessment on the patient. ALL assessment sites listed below have been assessed.      Areas assessed for pressure by both nurses:   [x]    Head, Face, and Ears   [x]    Shoulders, Back, and Chest, Abdomen  [x]    Arms, Elbows, and Hands   [x]    Coccyx, Sacrum, and Ischium  [x]    Legs, Feet, and Heels  No skin issues       Skin Assessed Under all Medical Devices by both nurses:  N/a               All Mepilex Borders were peeled back and area peeked at by both  nurses:  No: n/a  Please list where Mepilex Borders are located:  n/a             **SHARE this note so that the co-signing nurse is able to place an eSignature**    Co-signer eSignature: Electronically signed by Timoteo Expose, RN on 05/31/21 at 7:17 AM EDT    Does the Patient have Skin Breakdown related to pressure?  No     \         [place] Prevention initiated:  NA   Wound Care Orders initiated:  NA      WOC nurse consulted for Pressure Injury (Stage 3,4, Unstageable, DTI, NWPT, Complex wounds)and New or Established Ostomies:  NA      Primary Nurse eSignature: Electronically signed by Kenyon Ana, RN on 05/31/21 at 7:12 AM EDT

## 2021-05-31 NOTE — Progress Notes (Signed)
Transferred care to [place] , Therapist, sports. Face to face bedside report given, no need voiced at this time.

## 2021-05-31 NOTE — Consults (Signed)
Department of General Surgery Consult    PATIENT NAME: Vicki Hurst   DATE OF BIRTH: 1950/12/10    ADMISSION DATE: 05/30/2021  4:09 PM      TODAY'S DATE: 05/31/2021    Reason for Consult: Gallstones    Chief Complaint: Abdominal pain    Historian: Patient    Requesting Physician: Berenice Bouton    HISTORY OF PRESENT ILLNESS:              The patient is a 71 y.o. female who presents with abdominal pain.  This was in her mid epigastrium radiating up into her chest and through to her back.  She had nausea and vomiting as well.  This started yesterday afternoon when she got home from work.  She had a similar episode a month or so ago, but this resolved spontaneously.  Her current episode did not resolve, so she came to the emergency room.  She is currently asymptomatic.  She was initially diagnosed with gallstones in 2014 but then subsequently was also diagnosed with breast cancer and received treatment for this so put off gallbladder surgery.  Until recently, she has not had further gallbladder symptoms.    Past Medical History:        Diagnosis Date    Breast cancer ([facility])     Cancer ([facility]) 08/24/12    breast cancer    Cancer ([facility])     Breast    Gastric reflux        Past Surgical History:        Procedure Laterality Date    EYE SURGERY      TUNNELED VENOUS PORT PLACEMENT         Current Medications:   Current Facility-Administered Medications: melatonin tablet 6 mg, 6 mg, Oral, Nightly PRN  aspirin EC tablet 81 mg, 81 mg, Oral, Daily  regadenoson (LEXISCAN) injection 0.4 mg, 0.4 mg, IntraVENous, ONCE PRN  sodium chloride flush 0.9 % injection 5-40 mL, 5-40 mL, IntraVENous, 2 times per day  sodium chloride flush 0.9 % injection 5-40 mL, 5-40 mL, IntraVENous, PRN  0.9 % sodium chloride infusion, , IntraVENous, PRN  enoxaparin (LOVENOX) injection 40 mg, 40 mg, SubCUTAneous, Daily  ondansetron (ZOFRAN-ODT) disintegrating tablet 4 mg, 4 mg, Oral, Q8H PRN **OR** ondansetron (ZOFRAN) injection 4 mg, 4 mg, IntraVENous, Q6H  PRN  polyethylene glycol (GLYCOLAX) packet 17 g, 17 g, Oral, Daily PRN  acetaminophen (TYLENOL) tablet 650 mg, 650 mg, Oral, Q6H PRN **OR** acetaminophen (TYLENOL) suppository 650 mg, 650 mg, Rectal, Q6H PRN  0.9 % sodium chloride infusion, , IntraVENous, Continuous  famotidine (PEPCID) 20 mg in sodium chloride (PF) 0.9 % 10 mL injection, 20 mg, IntraVENous, BID  aluminum & magnesium hydroxide-simethicone (MAALOX) 200-200-20 MG/5ML suspension 30 mL, 30 mL, Oral, Q6H PRN  hydrALAZINE (APRESOLINE) injection 10 mg, 10 mg, IntraVENous, Q6H PRN  morphine (PF) injection 0.5 mg, 0.5 mg, IntraVENous, Q2H PRN **OR** morphine (PF) injection 1 mg, 1 mg, IntraVENous, Q4H PRN  Prior to Admission medications    Medication Sig Start Date End Date Taking? Authorizing Provider   alum Hydroxide-Mag Carbonate (GAVISCON EXTRA STRENGTH) 160-105 MG CHEW Take by mouth   Yes Historical Provider, MD   vitamin D (CHOLECALCIFEROL) 50 MCG (2000 UT) TABS tablet Take 1 tablet by mouth daily 03/18/21   Historical Provider, MD        Allergies:  Sulfa antibiotics, Keflex [cephalexin], Macrobid [nitrofurantoin monohyd macro], Nickel, and Sulfa antibiotics    Social History:   TOBACCO:  reports that she has never smoked. She has never used smokeless tobacco.  ETOH:   reports no history of alcohol use.  DRUGS:   reports no history of drug use.      Family History:    History reviewed. No pertinent family history.    REVIEW OF SYSTEMS:  CONSTITUTIONAL:  negative  HEENT:  negative  RESPIRATORY:  negative  CARDIOVASCULAR:  negative  GASTROINTESTINAL:  negative except for nausea, vomiting, and abdominal pain  GENITOURINARY:  negative  HEMATOLOGIC/LYMPHATIC:  negative  NEUROLOGICAL:  Negative  * All other ROS reviewed and negative.       PHYSICAL EXAM:  VITALS:  BP (!) 163/72   Pulse 77   Temp 98.1 F (36.7 C) (Oral)   Resp 18   Ht 5' 8.5" (1.74 m)   Wt 156 lb 8 oz (71 kg)   SpO2 98%   BMI 23.45 kg/m   24HR INTAKE/OUTPUT:    I/O last 3 completed  shifts:  In: 1000 [IV Piggyback:1000]  Out: -   No intake/output data recorded.      CONSTITUTIONAL:  alert, no apparent distress and normal weight  EYES:  PERRL, sclera clear  ENT:  Normocephalic,atraumatic, without obvious abnormality  NECK:  supple, symmetrical, trachea midline  LUNGS: Resp effort easy and unlabored, clear to auscultation  CARDIOVASCULAR:  NO JVD, regular rate and rhythm and no murmur noted  ABDOMEN:  normal bowel sounds, soft, non-distended, non-tender, voluntary guarding absent, no masses palpated and hernia absent  MUSCULOSKELETAL: No clubbing or cyanosis, 1+ pitting edema lower extremities  NEUROLOGIC:  Mental Status Exam:  Level of Alertness:   awake  PSYCHIATRIC:   person, place, time  SKIN:  no bruising or bleeding, normal skin color, texture, turgor, and no redness, warmth, or swelling    DATA:    CBC:   Recent Labs     05/30/21  1716 05/31/21  0627   WBC 5.2 2.6*   HGB 13.3 11.5*   HCT 40.6 34.1*   PLT 227 194     BMP:    Recent Labs     05/30/21  1716 05/31/21  0627   NA 139 140   K 4.4 4.3   CL 104 107   CO2 24 25   BUN 21* 17   CREATININE 0.9 0.8   GLUCOSE 128* 107*     Hepatic:   Recent Labs     05/30/21  1716 05/31/21  0627   AST 103* 327*   ALT 44* 352*   BILITOT 0.5 0.5   ALKPHOS 102 101     Mag:      Recent Labs     05/30/21  1716   MG 2.10      Phos:   No results for input(s): PHOS in the last 72 hours.   INR:   Recent Labs     05/30/21  1716   INR 0.91       Radiology Review: Images personally reviewed by me.   CT and ultrasound show cholelithiasis without secondary signs of cholecystitis    Nuclear cardiac scan showed no ischemia      IMPRESSION/RECOMMENDATIONS:    Symptomatic cholelithiasis, symptoms currently resolved.  Diet was started.  Okay for discharge from surgical standpoint on low-fat diet if she tolerates eating while here in the hospital.  We will arrange for interval outpatient robotic cholecystectomy, possible open procedure. I explained the procedure including  risks, benefits, and alternatives. Questions were answered and the  patient agrees to proceed.  My office will call to schedule      Electronically signed by Enoch Riding, MD     [facility]  [phone]

## 2021-05-31 NOTE — Other (Signed)
Stress  Lab

## 2021-05-31 NOTE — Discharge Summary (Signed)
Physician Discharge Summary     Patient ID:  Vicki Hurst  [id number]  71 y.o.  January 31, 1950    Admit date: 05/30/2021    Discharge date and time: 05/31/2021  5:15 PM     Admitting Physician: Gordy Savers, MD     Discharge Physician: Fritzi Mandes, MD    Admission Diagnoses: Abdominal pain, epigastric [R10.13]  Chest pain [R07.9]  Elevated LFTs [R79.89]  Calculus of gallbladder without cholecystitis without obstruction [K80.20]  Chest pain, unspecified type [R07.9]  Acute pancreatitis, unspecified complication status, unspecified pancreatitis type [K85.90]  Hypertension, unspecified type [I10]    Discharge Diagnoses: Principal Problem:    Chest pain  Active Problems:    Calculus of gallbladder without cholecystitis without obstruction  Resolved Problems:    * No resolved hospital problems. *      Admission Condition: fair    Discharged Condition: stable    Indication for Admission:    Per hpi  71 y.o. female  who presents to the ED complaining of lower chest pain radiating to her back associated with nausea and multiple rounds of emesis. While she complains of chest pain she is actually pointing more to her abdomen is having significant GI symptoms such as nausea and vomiting and has tenderness in the epigastrium. Laboratory studies do note an elevated lipase.  Question pancreatitis.  Right upper quadrant ultrasound shows cholelithiasis without cholecystitis and CT scan shows similar findings without obvious signs of pancreatitis.  EKG which was nondiagnostic.  Initial troponin undetectable though repeat troponin was found to be 9. Given the potential for acute new onset pancreatitis or possibly ACS suspect admission warranted.  Otherwise is feeling better with treatment here .Heart score is 4 with 1 for suspicious, 2 for age, 1 for risk (hypertension).            Hospital Course:     # Symptomatic cholelithiasis.    # Chest pain/ upper abd pain  likely secondary to cholelithiasis   -  Seen by general surgery.  She  will need to follow-up with Dr.Shiff for outpatient elective cholecystectomy.     # Elevated troponin  -Cardiac stress test completed and this is negative  - Echocardiogram pending-can be done as outpatient .    # elevated LFts  - due to cholelithiasis. Pancreatitis ruled out  Seen by GI   -acute Hepatitis panel Pending    # HTN  - BP better today    Consults: GI, general surgery    Significant Diagnostic Studies:   Data:  CBC:   Recent Labs     05/30/21  1716 05/31/21  0627   WBC 5.2 2.6*   RBC 4.68 4.02   HGB 13.3 11.5*   HCT 40.6 34.1*   MCV 86.7 84.7   RDW 13.3 13.2   PLT 227 194     BMP:   Recent Labs     05/30/21  1716 05/31/21  0627   NA 139 140   K 4.4 4.3   CL 104 107   CO2 24 25   BUN 21* 17   CREATININE 0.9 0.8     BNP: No results for input(s): BNP in the last 72 hours.  PT/INR:   Recent Labs     05/30/21  1716   PROTIME 12.3   INR 0.91     APTT: No results for input(s): APTT in the last 72 hours.  CARDIAC ENZYMES: No results for input(s): CKMB, CKMBINDEX, TROPONINI in the last  72 hours.    Invalid input(s): CKTOTAL;3  FASTING LIPID PANEL:No results found for: CHOL, HDL, TRIG  LIVER PROFILE:   Recent Labs     05/30/21  1716 05/31/21  0627   AST 103* 327*   ALT 44* 352*   BILIDIR <0.2  --    BILITOT 0.5 0.5   ALKPHOS 102 101         Treatments: as above    Discharge Exam:  Blood pressure (!) 164/70, pulse 71, temperature 97.2 F (36.2 C), temperature source Oral, resp. rate 16, height 5' 8.5" (1.74 m), weight 156 lb 8 oz (71 kg), SpO2 98 %.  Constitutional:       Appearance: Normal appearance. She is well-developed and normal weight.   HENT:      Head: Normocephalic and atraumatic.      Right Ear: External ear normal.      Left Ear: External ear normal.   Eyes:      General:         Right eye: No discharge.         Left eye: No discharge.      Conjunctiva/sclera: Conjunctivae normal.   Cardiovascular:      Rate and Rhythm: Normal rate and regular rhythm.      Heart sounds: Murmur heard.   Pulmonary:       Effort: Pulmonary effort is normal.      Breath sounds: Normal breath sounds.   Abdominal:      General: Abdomen is flat. Bowel sounds are normal.      Palpations: Abdomen is soft.      Tenderness: There is RUQ abdominal tenderness. There is no right CVA tenderness or left CVA tenderness.   Musculoskeletal:         General: Normal range of motion.      Cervical back: Normal range of motion and neck supple.      Right lower leg: No edema.      Left lower leg: No edema.   Skin:     General: Skin is warm and dry.   Neurological:      General: No focal deficit present.      Mental Status: She is alert and oriented to person, place, and time. Mental status is at baseline.   Psychiatric:         Mood and Affect: Mood normal.         Behavior: Behavior normal.         Thought Content: Thought content normal.         Judgment: Judgment normal.   _______________________________________________________________________    Disposition: home    Patient Instructions:      Medication List        CONTINUE taking these medications      Gaviscon Extra Strength 160-105 MG Chew  Generic drug: alum Hydroxide-Mag Carbonate     vitamin D 50 MCG (2000 UT) Tabs tablet  Commonly known as: CHOLECALCIFEROL            STOP taking these medications      amoxicillin 500 MG capsule  Commonly known as: AMOXIL     anastrozole 1 MG tablet  Commonly known as: ARIMIDEX     loratadine 10 MG capsule  Commonly known as: CLARITIN     LORazepam 0.5 MG tablet  Commonly known as: ATIVAN     naproxen sodium 550 MG tablet  Commonly known as: ANAPROX  Activity: activity as tolerated  Diet: low fat, low cholesterol diet  Wound Care: as directed    Follow-up with PCP, surgeon  in 1 week.    Signed:  Harl Bowie, MD  05/31/2021  3:24 PM

## 2021-05-31 NOTE — Progress Notes (Signed)
Diet started. Dietary notified for lunch.

## 2021-05-31 NOTE — Other (Signed)
05/31/21 0748   Vital Signs   Temp 98 F (36.7 C)   Temp Source Oral   Pulse 58   Heart Rate Source Monitor   Respirations 18   BP (!) 152/75   MAP (Calculated) 101   BP Location Left upper arm   BP Method Automatic   Patient Position Semi fowlers   Level of Consciousness 0   MEWS Score 1   Oxygen Therapy   SpO2 98 %   O2 Device None (Room air)     AM assessment complete. Gave am meds due see mar. Held lovenox, asa-GI,SX consult awaiting. A/O x 4. Denies any pain. NPO-IC, sips with meds. Independent/low fall risk. Bed locked/lowest position. Call light within reach.

## 2021-05-31 NOTE — Consults (Signed)
[facility]   CONSULTATION  [phone]        Reason for Consultation/Chief Complaint: "I have been having chest pain elevated troponin ."    History of Present Illness:  Vicki Hurst is a 71 y.o. patient who presented to the hospital with complaints of substernal chest pain severe squeezing, she doubled over. It happened in afternoon of 4.12.23 after she ate some cookies and drank coffee. She  vomited x10. No blood in vomiting. No sweating No shortness of breath. No recurrence of pain.  It lasted half an hr. No prior pain like that  but she has had abdominal pain in past due to gall stones which have not been operated on yet.  No prior heart disease. No DM non smoker and no family history of  heart disease.  She had rt breast   cancer 2014 took chemo x4  but did not require surgery as the tumor disappeared with chemo which included Herceptin.  I have been asked to provide consultation regarding further management and testing.      Past Medical History:   has a past medical history of Breast cancer (HCC), Cancer (HCC), Cancer (HCC), and Gastric reflux.    Surgical History:   has a past surgical history that includes eye surgery and Tunneled venous port placement.     Social History:   reports that she has never smoked. She has never used smokeless tobacco. She reports that she does not drink alcohol and does not use drugs.     Family History:  Negative for heart disease    Home Medications:  Were reviewed and are listed in nursing record. and/or listed below  Prior to Admission medications    Medication Sig Start Date End Date Taking? Authorizing Provider   alum Hydroxide-Mag Carbonate (GAVISCON EXTRA STRENGTH) 160-105 MG CHEW Take by mouth   Yes Historical Provider, MD   vitamin D (CHOLECALCIFEROL) 50 MCG (2000 UT) TABS tablet Take 1 tablet by mouth daily 03/18/21   Historical Provider, MD        Allergies:  Sulfa antibiotics, Keflex [cephalexin], Macrobid [nitrofurantoin monohyd macro], Nickel,  and Sulfa antibiotics     Review of Systems:   12 point ROS negative in all areas as listed below except as in HOPI  Constitutional, EENT, pulmonary, GI, GU, Musculoskeletal, skin, neurological, hematological, endocrine, Psychiatric    Physical Examination:    Vitals:    05/31/21 0748   BP: (!) 152/75   Pulse: 58   Resp: 18   Temp: 98 F (36.7 C)   SpO2: 98%    Weight - Scale: 156 lb 8 oz (71 kg)         General Appearance:  Alert, cooperative, no distress, appears stated age   Head:  Normocephalic, without obvious abnormality, atraumatic   Eyes:  PERRL, conjunctiva/corneas clear       Nose: Nares normal, no drainage or sinus tenderness   Throat: Lips, mucosa, and tongue normal   Neck: Supple, symmetrical, trachea midline, no adenopathy, thyroid: not enlarged, symmetric, no tenderness/mass/nodules, no carotid bruit or JVD       Lungs:   Clear to auscultation bilaterally, respirations unlabored   Chest Wall:  No tenderness or deformity   Heart:  Regular rate and rhythm, S1, S2 normal, no murmur, rub or gallop   Abdomen:   Soft, non-tender, bowel sounds active all four quadrants,  no masses, no organomegaly           Extremities:  Extremities normal, atraumatic, no cyanosis or edema   Pulses: 2+ and symmetric   Skin: Skin color, texture, turgor normal, no rashes or lesions   Pysch: Normal mood and affect   Neurologic: Normal gross motor and sensory exam.         Labs  CBC:   Lab Results   Component Value Date/Time    WBC 2.6 05/31/2021 06:27 AM    RBC 4.02 05/31/2021 06:27 AM    HGB 11.5 05/31/2021 06:27 AM    HCT 34.1 05/31/2021 06:27 AM    MCV 84.7 05/31/2021 06:27 AM    RDW 13.2 05/31/2021 06:27 AM    PLT 194 05/31/2021 06:27 AM     CMP:    Lab Results   Component Value Date/Time    NA 140 05/31/2021 06:27 AM    K 4.3 05/31/2021 06:27 AM    CL 107 05/31/2021 06:27 AM    CO2 25 05/31/2021 06:27 AM    BUN 17 05/31/2021 06:27 AM    CREATININE 0.8 05/31/2021 06:27 AM    GFRAA >60 02/15/2017 10:25 AM    AGRATIO 1.5  05/31/2021 06:27 AM    LABGLOM >60 05/31/2021 06:27 AM    GLUCOSE 107 05/31/2021 06:27 AM    PROT 6.1 05/31/2021 06:27 AM    CALCIUM 8.8 05/31/2021 06:27 AM    BILITOT 0.5 05/31/2021 06:27 AM    ALKPHOS 101 05/31/2021 06:27 AM    AST 327 05/31/2021 06:27 AM    ALT 352 05/31/2021 06:27 AM     PT/INR:  No results found for: PTINR  No results found for: CKTOTAL, CKMB, CKMBINDEX, TROPONINI    EKG:  I have reviewed EKG with the following interpretation:  Impression:  Diagnosis Normal sinus rhythmNormal ECGNo previous ECGs availableConfirmed by Kaitlynne Wenz MD, Vauda Salvucci ([id number]) on 05/31/2021 7:38:34 AM     ONE XRAY VIEW OF THE CHEST     05/30/2021 4:13 pm     COMPARISON:  None.     HISTORY:  ORDERING SYSTEM PROVIDED HISTORY: Chest pain  TECHNOLOGIST PROVIDED HISTORY:  Reason for exam:->Chest pain  Reason for Exam: chest pain     FINDINGS:  Normal cardiomediastinal silhouette.  No acute airspace infiltrate.  No  pneumothorax or pleural effusion     IMPRESSION:  No acute cardiopulmonary findings       IMPRESSION:  Cholelithiasis.  Colonic diverticulosis without acute diverticulitis.    ALT 352  AST 327  HSTrop  6  9  23  17    Assessment  Chest pain consistent with new onset angina  Elevated high sensitivity troponin  rise and fall consistent with acute myocardial injury  Cholelethiasis with Biliary colic  Elevated liver enzymes due to gall stones  Chronic leukopenia    Plan   repeat EKG now is normal  patient is pain free  Echo doppler of heart  Lexiscan myoview stress test.  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     I recommend that the patient undergo above investigations    I will address the patient's cardiac risk factors and adjusted pharmacologic treatment as needed. In addition, I have reinforced the need for patient directed risk factor modification.    Thank you for allowing to [place] to participate in the care or Vicki Hurst. Further evaluation will be based upon the patient's clinical course and testing results.    All questions and concerns were addressed to the patient/family. Alternatives to my treatment were discussed. The note was completed using EMR. Every effort was made to ensure accuracy; however, inadvertent computerized transcription errors may be present.

## 2021-05-31 NOTE — Progress Notes (Signed)
Bedside Mobility Assessment Tool (BMAT):     Assessment Level 1- Sit and Shake    1. From a semi-reclined position, ask patient to sit up and rotate to a seated position at the side of the bed. Can use the bedrail.    2. Ask patient to reach out and grab your hand and shake making sure patient reaches across his/her midline.   Pass- Patient is able to come to a seated position, maintain core strength. Maintains seated balance while reaching across midline. Move on to Assessment Level 2.     Assessment Level 2- Stretch and Point   1. With patient in seated position at the side of the bed, have patient place both feet on the floor (or stool) with knees no higher than hips.    2. Ask patient to stretch one leg and straighten the knee, then bend the ankle/flex and point the toes. If appropriate, repeat with the other leg.   Pass- Patient is able to demonstrate appropriate quad strength on intended weight bearing limb(s). Move onto Assessment Level 3.     Assessment Level 3- Stand   1. Ask patient to elevate off the bed or chair (seated to standing) using an assistive device (cane, bedrail).    2. Patient should be able to raise buttocks off be and hold for a count of five. May repeat once.   Pass- Patient maintains standing stability for at least 5 seconds, proceed to assessment level 4.    Assessment Level 4- Walk   1. Ask patient to march in place at bedside.    2. Then ask patient to advance step and return each foot. Some medical conditions may render a patient from stepping backwards, use your best clinical judgement.   Pass- Patient demonstrates balance while shifting weight and ability to step, takes independent steps, does not use assistive device patient is MOBILITY LEVEL 4.      Mobility Level- 4

## 2021-05-31 NOTE — Progress Notes (Signed)
Pt in gown/hospital pants for nuclear stress. NPO since admitted to floor @ 2215. Pt states had caffeine yesterday am. IVF stopped. IV clamped. Awaiting transport to Nuclear.

## 2021-05-31 NOTE — Progress Notes (Signed)
Cardiology, GI and general surgery consulted.

## 2021-06-02 ENCOUNTER — Inpatient Hospital Stay: Admit: 2021-06-02 | Discharge: 2021-06-02 | Disposition: A | Discharge status: Home / Self Care | Payer: MEDICARE

## 2021-06-02 ENCOUNTER — Emergency Department: Payer: PRIVATE HEALTH INSURANCE | Primary: Family Medicine

## 2021-06-02 DIAGNOSIS — R531 Weakness: Secondary | ICD-10-CM

## 2021-06-02 LAB — COMPREHENSIVE METABOLIC PANEL W/ REFLEX TO MG FOR LOW K
ALT: 146 U/L — ABNORMAL HIGH (ref 10–40)
AST: 73 U/L — ABNORMAL HIGH (ref 15–37)
Albumin/Globulin Ratio: 1.5 (ref 1.1–2.2)
Albumin: 4.5 g/dL (ref 3.4–5.0)
Alkaline Phosphatase: 119 U/L (ref 40–129)
Anion Gap: 10 (ref 3–16)
BUN: 18 mg/dL (ref 7–20)
CO2: 24 mmol/L (ref 21–32)
Calcium: 9.7 mg/dL (ref 8.3–10.6)
Chloride: 103 mmol/L (ref 99–110)
Creatinine: 0.7 mg/dL (ref 0.6–1.2)
Est, Glom Filt Rate: >60 (ref >60)
Glucose: 100 mg/dL — ABNORMAL HIGH (ref 70–99)
Potassium reflex Magnesium: 4 mmol/L (ref 3.5–5.1)
Sodium: 137 mmol/L (ref 136–145)
Total Bilirubin: 0.5 mg/dL (ref 0.0–1.0)
Total Protein: 7.5 g/dL (ref 6.4–8.2)

## 2021-06-02 LAB — CBC WITH AUTO DIFFERENTIAL
Basophils %: 2 %
Basophils Absolute: 0.1 10*3/uL (ref 0.0–0.2)
Eosinophils %: 1.6 %
Eosinophils Absolute: 0.1 10*3/uL (ref 0.0–0.6)
Hematocrit: 38.6 % (ref 36.0–48.0)
Hemoglobin: 13.1 g/dL (ref 12.0–16.0)
Lymphocytes %: 30 %
Lymphocytes Absolute: 1 10*3/uL (ref 1.0–5.1)
MCH: 28.6 pg (ref 26.0–34.0)
MCHC: 33.9 g/dL (ref 31.0–36.0)
MCV: 84.5 fL (ref 80.0–100.0)
MPV: 8.4 fL (ref 5.0–10.5)
Monocytes %: 8.9 %
Monocytes Absolute: 0.3 10*3/uL (ref 0.0–1.3)
Neutrophils %: 57.5 %
Neutrophils Absolute: 1.9 10*3/uL (ref 1.7–7.7)
Platelets: 220 10*3/uL (ref 135–450)
RBC: 4.57 M/uL (ref 4.00–5.20)
RDW: 13.4 % (ref 12.4–15.4)
WBC: 3.4 10*3/uL — ABNORMAL LOW (ref 4.0–11.0)

## 2021-06-02 LAB — TROPONIN: Troponin, High Sensitivity: 8 ng/L (ref 0–14)

## 2021-06-02 NOTE — Telephone Encounter (Signed)
-----   Message from Sellers Riding, MD sent at 05/31/2021 12:47 PM EDT -----  Going home. Please call to schedule robot chole with gram. Dx K80.20. 45 min. Outpatient. General

## 2021-06-02 NOTE — Telephone Encounter (Signed)
scheduled

## 2021-06-02 NOTE — ED Provider Notes (Signed)
[facility] [place] [place] [facility]  EMERGENCY DEPARTMENT ENCOUNTER        Pt Name: Vicki Hurst  MRN: [id number]  [place] 1950-04-20  Date of evaluation: 06/02/2021  Provider: Holli Humbles, APRN - CNP  PCP: Bryson Ha, MD  Note Started: 10:47 AM EDT 06/02/21      APP. I have evaluated this patient.  My supervising physician was available for consultation.      CHIEF COMPLAINT       Chief Complaint   Patient presents with    Fatigue     Reports here and admitted Friday night-d/c'd Saturday. Reports Today legs felt weak/wobbly/shaky and has stopped feeling that way but still feel weak. Reports this happened last Thursday as well       HISTORY OF PRESENT ILLNESS: 1 or more Elements     History From: Patient    Limitations to history : None    Social Determinants Significantly Affecting Health : None    Chief Complaint: Episode of general fatigue.    Vicki Hurst is a 71 y.o. female who presents to the emerged department for an episode of general fatigue.  States that she had episode today where her both arms and legs felt weak and shaky.  States she felt lightheaded but not dizzy.  States that she felt as though she was going to pass out.  States that symptoms were brief lasted for a few moments.  States that she recovered quite well.  States that she still feels shaky in both legs equally.  Denies any neck pain or back pain.  No chest pain.  Reported some discomfort in her left hand early this morning but denies any known injury or trauma to her hand.  No swelling.  Denies any other acute complaint at this time states otherwise feeling much better since arriving in the emergency department    Nursing Notes were all reviewed and agreed with or any disagreements were addressed in the HPI.    REVIEW OF SYSTEMS :      Review of Systems   Constitutional:  Positive for fatigue. Negative for chills, diaphoresis and fever.   HENT:  Negative for congestion, ear pain, rhinorrhea and sore throat.    Eyes:  Negative for  pain and visual disturbance.   Respiratory:  Negative for cough and shortness of breath.    Cardiovascular:  Negative for chest pain and leg swelling.   Gastrointestinal:  Negative for abdominal pain, nausea and vomiting.   Genitourinary:  Negative for difficulty urinating, dysuria, flank pain and frequency.   Musculoskeletal:  Negative for back pain and neck pain.   Skin:  Negative for rash and wound.   Neurological:  Positive for light-headedness. Negative for dizziness, syncope, speech difficulty and headaches.     Positives and Pertinent negatives as per HPI.     SURGICAL HISTORY     Past Surgical History:   Procedure Laterality Date    EYE SURGERY      TUNNELED VENOUS PORT PLACEMENT         CURRENTMEDICATIONS       Previous Medications    ALUM HYDROXIDE-MAG CARBONATE (GAVISCON EXTRA STRENGTH) 160-105 MG CHEW    Take by mouth    VITAMIN D (CHOLECALCIFEROL) 50 MCG (2000 UT) TABS TABLET    Take 1 tablet by mouth daily       ALLERGIES     Sulfa antibiotics, Keflex [cephalexin], Macrobid [nitrofurantoin monohyd macro], Nickel, and Sulfa antibiotics  FAMILYHISTORY     No family history on file.     SOCIAL HISTORY       Social History     Tobacco Use    Smoking status: Never    Smokeless tobacco: Never   Substance Use Topics    Alcohol use: No    Drug use: No       SCREENINGS   NIH Stroke Scale  Level of Consciousness (1a): Alert  LOC Questions (1b): Answers both correctly  LOC Commands (1c): Performs both tasks correctly  Best Gaze (2): Normal  Visual (3): No visual loss  Facial Palsy (4): Normal symmetrical movement  Motor Arm, Left (5a): No drift  Motor Arm, Right (5b): No drift  Motor Leg, Left (6a): No drift  Motor Leg, Right (6b): No drift  Limb Ataxia (7): Absent  Sensory (8): Normal  Best Language (9): No aphasia  Dysarthria (10): Normal  Extinction and Inattention (11): No abnormality  Total: 0    Glasgow Coma Scale  Eye Opening: Spontaneous  Best Verbal Response: Oriented  Best Motor Response: Obeys  commands  Glasgow Coma Scale Score: 15                CIWA Assessment  BP: (!) 193/80  Pulse: 70           PHYSICAL EXAM  1 or more Elements     ED Triage Vitals [06/02/21 1023]   BP Temp Temp src Pulse Respirations SpO2 Height Weight - Scale   (!) 199/91 97.3 F (36.3 C) -- 68 18 97 % '5\' 8"'  (1.727 m) 156 lb (70.8 kg)       Physical Exam  Vitals and nursing note reviewed.   Constitutional:       Appearance: Normal appearance. She is not toxic-appearing or diaphoretic.   HENT:      Head: Normocephalic and atraumatic.      Nose: Nose normal.   Eyes:      General: No visual field deficit.        Right eye: No discharge.         Left eye: No discharge.   Cardiovascular:      Rate and Rhythm: Normal rate and regular rhythm.      Pulses: Normal pulses.      Heart sounds: No murmur heard.  Pulmonary:      Effort: Pulmonary effort is normal. No respiratory distress.      Breath sounds: No wheezing or rhonchi.   Abdominal:      Palpations: Abdomen is soft.      Tenderness: There is no abdominal tenderness. There is no guarding or rebound.   Musculoskeletal:         General: Normal range of motion.      Cervical back: Normal range of motion and neck supple.   Skin:     General: Skin is warm and dry.   Neurological:      General: No focal deficit present.      Mental Status: She is alert and oriented to person, place, and time.      GCS: GCS eye subscore is 4. GCS verbal subscore is 5. GCS motor subscore is 6.      Cranial Nerves: No dysarthria or facial asymmetry.      Sensory: Sensation is intact. No sensory deficit.      Motor: Motor function is intact. No weakness or pronator drift.      Coordination: Coordination is intact. Finger-Nose-Finger Test  and Heel to [facility] Test normal.   Psychiatric:         Mood and Affect: Mood normal.         Behavior: Behavior normal.     Test of skew negative. No abdominal ataxia     DIAGNOSTIC RESULTS   LABS:    Labs Reviewed   CBC WITH AUTO DIFFERENTIAL - Abnormal; Notable for the  following components:       Result Value    WBC 3.4 (*)     All other components within normal limits   COMPREHENSIVE METABOLIC PANEL W/ REFLEX TO MG FOR LOW K - Abnormal; Notable for the following components:    Glucose 100 (*)     ALT 146 (*)     AST 73 (*)     All other components within normal limits   TROPONIN   TROPONIN   URINALYSIS WITH REFLEX TO CULTURE       When ordered only abnormal lab results are displayed. All other labs were within normal range or not returned as of this dictation.    EKG: When ordered, EKG's are interpreted by the Emergency Department Physician in the absence of a cardiologist.  Please see their note for interpretation of EKG.    RADIOLOGY:   Non-plain film images such as CT, Ultrasound and MRI are read by the radiologist. Plain radiographic images are visualized and preliminarily interpreted by the ED Provider with the below findings:        Interpretation per the Radiologist below, if available at the time of this note:    XR CHEST PORTABLE    (Results Pending)     CT ABDOMEN PELVIS W IV CONTRAST Additional Contrast? None    Result Date: 05/30/2021  EXAMINATION: CT OF THE ABDOMEN AND PELVIS WITH CONTRAST 05/30/2021 6:34 pm TECHNIQUE: CT of the abdomen and pelvis was performed with the administration of 75 mL Isovue 370 intravenous contrast. Multiplanar reformatted images are provided for review. Automated exposure control, iterative reconstruction, and/or weight based adjustment of the mA/kV was utilized to reduce the radiation dose to as low as reasonably achievable. COMPARISON: Correlation with concurrent ultrasound HISTORY: Acute mid chest pain radiating to the back, elevated lipase. FINDINGS: Lower Chest: Mild dependent atelectasis at the lung bases. Organs: Cholelithiasis without gallbladder inflammation.  Renal cysts, right greater than left, measure up to 4.1 cm; no dedicated follow-up recommended. Remaining solid organs are unremarkable. GI/Bowel: No bowel obstruction.   Normal appendix.  Left colonic diverticulosis without acute inflammation. Pelvis: Urinary bladder is distended.  Uterus is unremarkable.  No lymphadenopathy or free fluid. Peritoneum/Retroperitoneum: No ascites or significant lymphadenopathy. Extensive atherosclerotic disease of the abdominal aorta without aneurysm. Bones/Soft Tissues: Osteopenia.  Scattered degenerative changes throughout the visualized spine.     Cholelithiasis.  Colonic diverticulosis without acute diverticulitis.     XR CHEST PORTABLE    Result Date: 05/30/2021  EXAMINATION: ONE XRAY VIEW OF THE CHEST 05/30/2021 4:13 pm COMPARISON: None. HISTORY: ORDERING SYSTEM PROVIDED HISTORY: Chest pain TECHNOLOGIST PROVIDED HISTORY: Reason for exam:->Chest pain Reason for Exam: chest pain FINDINGS: Normal cardiomediastinal silhouette.  No acute airspace infiltrate.  No pneumothorax or pleural effusion     No acute cardiopulmonary findings     US ABDOMEN LIMITED Specify organ? LIVER, GALLBLADDER, PANCREAS    Result Date: 05/30/2021  EXAMINATION: RIGHT UPPER QUADRANT ULTRASOUND 05/30/2021 4:50 pm COMPARISON: None. HISTORY: Acute chest/abdominal pain with nausea/vomiting. FINDINGS: LIVER:  Normal echogenicity without discrete lesion.  No intrahepatic biliary ductal  dilatation. BILIARY SYSTEM:  Gallbladder is filled with shadowing stones.  No significant gallbladder wall thickening or pericholecystic fluid.  Negative sonographic Murphy's sign. The extrahepatic bile duct is within normal limits, measuring less than 3 mm. RIGHT KIDNEY:  No hydronephrosis.  Several cysts measure up to 4.2 cm. PANCREAS: Visualized portions are unremarkable. OTHER:  No ascites seen.     Cholelithiasis without other findings to suggest acute cholecystitis.     NM Cardiac Stress Test Nuclear Imaging    Result Date: 05/31/2021  Cardiac Perfusion Imaging  Demographics   Patient Name       [facility]   Date of Study      05/31/2021         Gender              Female   Patient Number      [id number]         Date of Birth       Mar 27, 1950   Visit Number       [id number]          Age                 82 year(s)   Accession Number   [id number]         Room Number         0209   Corporate ID       [id number]            NM Technician       Joya San, CNMT   Nurse              Dannette Barbara     Interpreting        [facility]                                        Physician           Brynda Greathouse, MD   Ordering Physician Baruch Goldmann,                     MD, [facility]   The procedure was explained in detail to the patient. Risks,  complications and alternative treatments were reviewed. Written consent  was obtained.  Procedure Procedure Type:   Nuclear Stress Test:Exercise   Study location: [facility] - Nuclear Medicine   Indications: Angina Pectoris.                Hospital Status: Inpatient.  Height: 68 inches Weight: 156 pounds  Conclusions   Summary  Fair exercise capacity. No ischemic ECG changes. Occasional PVCs.  Normal systolic function. Left ventricular ejection fraction of 69%. Normal  wall motion.  There is normal isotope uptake at stress and rest. There is no evidence of  myocardial ischemia or scar.  Overall findings represent a low risk scan.  Stress Protocols   Resting HR:70 bpm        Resting BP:174/81 mmHg   Pre-stress physical exam: Patient denied chest pain.  Stress  Protocol:Exercise - Bruce  Peak HR:162 bpm                          HR/BP product:34182  Peak BP:211/73 mmHg                      Max exercise: 4.6 METS  Predicted HR: 151 bpm  % of predicted HR: 107                   Exercise effort:Good  Test duration:6 min and 6 sec  Reason for termination:Target heart rate  Perceived exertion:3   Symptoms  Patient met target heart rate in stage 1, stage held then  hill/speed lowered due to patient exceeding 100% max heart  rate. Ectopy noted, patient denied chest pain or palpitations,  complained of being  winded and leg pain, all symptoms resolved  with rest.   Complications  Procedure complication was none.   Imaging Protocols   - One Day   Rest                          Stress   Isotope:Myoview/Tetrofosmin   Isotope: Myoview/Tetrofosmin  Isotope dose:11 mCi           Isotope dose:30 mCi  Administration Route:I.V.     Administration Route:I.V.  Date:05/31/2021 10:00         Date:05/31/2021 10:45                                 Technique:      Gated  Imaging Results   Applied corrections   - Attenuation correction  applied     Stress ejection    Ejection fraction:69 %    EDV :65 ml    ESV :20 ml    Stroke volume :45 ml    LV mass :108 gr  Medical History   Additional Medical History   HX breast cancer and DVT   Signatures   ------------------------------------------------------------------  Electronically signed by Brynda Greathouse, MD (Interpreting  physician) on 05/31/2021 at 12:24  ------------------------------------------------------------------        No results found.    PROCEDURES   Unless otherwise noted below, none     Procedures    CRITICAL CARE TIME (.cctime)       PAST MEDICAL HISTORY      has a past medical history of Breast cancer ([place]), Cancer ([place]) (08/24/12), Cancer ([place]), and Gastric reflux.     EMERGENCY DEPARTMENT COURSE and DIFFERENTIAL DIAGNOSIS/MDM:   Vitals:    Vitals:    06/02/21 1023 06/02/21 1126   BP: (!) 199/91 (!) 193/80   Pulse: 68 70   Resp: 18 16   Temp: 97.3 F (36.3 C)    SpO2: 97% 99%   Weight: 156 lb (70.8 kg)    Height: '5\' 8"'  (1.727 m)        Patient was given the following medications:  Medications - No data to display          Is this patient to be included in the SEP-1 Core Measure due to severe sepsis or septic shock?   No   Exclusion criteria - the patient is NOT to be included for SEP-1 Core Measure due to:  Infection is not suspected        Chronic Conditions affecting care:  has a past medical history of Breast cancer ([place]), Cancer ([place]) (08/24/12), Cancer ([place]), and  Gastric reflux.    CONSULTS: (Who and What was discussed)  None          Records Reviewed (External and Source)     CC/HPI Summary, DDx, ED Course, and Reassessment: Seen evaluate here in the emerged department.  The patient was also noted to have elevated blood pressure reading which concerned the patient likely does have hypertension.  Patient states that at home her blood pressure readings are quite normal.  States that usually her systolic pressure being about 117.  EKG was nonischemic, initial troponin was negative.  I had a detail discussed with her to keep a journal of her blood pressure readings and presented to her family doctor.  Advised that patient can use diabetes/intermittently for blood pressure management.  The patient also noted to have mild transaminitis which is downtrending from before.  Likely to chololithiasis.  The patient is scheduled to have a cholecystectomy with her general surgeon Dr. Lanelle Bal in the near future.  In the meantime patient has no pain.  States that her symptoms of general weakness have resolved.  States she feels much better.  Her NIH score here was 0.  Test of skew negative.  Amatory difficulty.  No abdominal ataxia.  The meantime patient requested be discharged.  I ordered chest x-ray, urinalysis, repeat troponin and she states that she does not want stay for those.  With shared decision making the patient says she wants to leave and at this time I encouraged her to return back to the ED if she  has any further acute concerns.  The meantime plan for follow-up with PCP.  Provided strict return precautions.  Patient verbalized understanding agrees with    Disposition Considerations (tests considered but not done, Admit vs D/C, Shared Decision Making, Pt Expectation of Test or Tx.):       I am the Primary Clinician of Record.  FINAL IMPRESSION      1. Generalized weakness    2. Elevated blood pressure reading    3. Elevated liver enzymes          DISPOSITION/PLAN      DISPOSITION Decision To Discharge 06/02/2021 11:31:22 AM      PATIENT REFERRED TO:  Bryson Ha, MD  [address]  [address] [place] [address]  [phone]    Schedule an appointment as soon as possible for a visit       [facility] ED  [place] [address]  [phone]  Go to   If symptoms worsen      DISCHARGE MEDICATIONS:  New Prescriptions    No medications on file       DISCONTINUED MEDICATIONS:  Discontinued Medications    No medications on file              (Please note that portions of this note were completed with a voice recognition program.  Efforts were made to edit the dictations but occasionally words are mis-transcribed.)    Holli Humbles, APRN - CNP (electronically signed)       Holli Humbles, APRN - CNP  06/02/21 1136

## 2021-06-02 NOTE — Telephone Encounter (Signed)
lmtcb

## 2021-06-03 LAB — EKG 12-LEAD
Atrial Rate: 67 {beats}/min
P Axis: 52 degrees
P-R Interval: 124 ms
Q-T Interval: 404 ms
QRS Duration: 78 ms
QTc Calculation (Bazett): 426 ms
R Axis: 37 degrees
T Axis: 43 degrees
Ventricular Rate: 67 {beats}/min

## 2021-06-05 ENCOUNTER — Encounter: Payer: MEDICARE | Attending: Surgery | Primary: Family Medicine

## 2021-06-06 NOTE — Progress Notes (Signed)
.  1.  Do not eat or drink anything after 12 midnight prior to surgery.  This includes no water, chewing gum or mints.  2.  Take the following pills with a small sip of water on the morning of surgery 06/17/2021.  3.  Aspirin, Ibuprofen, Advil, Naproxen, Vitamin E and other Anti-inflammatory products should be stopped for 5 days before surgery or as directed by your physician.  4.  Check with your doctor regarding stopping Plavix, Coumadin, Lovenox, Fragmin or other blood thinners.  5.  Do not smoke and do not drink alcoholic beverages 24 hours prior to surgery.  This includes NA Beer.  6.  You may brush your teeth and gargle the morning of surgery.  DO NOT SWALLOW WATER.  7.  You MUST make arrangements for a responsible adult to take you home after your surgery.  You will not be allowed to leave alone or drive yourself home.  It is strongly suggested someone stay with you the first 24 hours.  Your surgery will be cancelled if you do not have a ride home.  8.  A parent/legal guardian must accompany a child scheduled for surgery and plan to stay at the hospital until the child is discharged.  Please do not bring other children with you.  9.  Please wear simple, loose fitting clothing to the hospital.  Do not bring valuables ( money, credit cards, checkbooks, etc.)  Do not wear any makeup (including no eye makeup) or nail polish on your fingers or toes.  10.  Do not wear any jewelry or piercing on the day of surgery.  All body piercing jewelry must be removed.  11.  If you have dentures, they will be removed before going to the OR; we will provide you a container.  If you wear contact lenses or glasses, they will be removed; please bring a case for them.  12.  Please see your family doctor/pediatrician for a history & physical and/or concerning medications.  Bring any test results/reports from your physician's office the day of surgery.    13.  Remember to bring Blood Bank Bracelet to the hospital on the day of  surgery.  14.  If you have a Living Will and Durable Power of Attorney for Healthcare, please bring in a copy.  15.  Notify your Surgeon if you develop any illness between now and surgery time; cough, cold, fever, sore throat, nausea, vomiting, etc.  Please notify your surgeon if you experience dizziness, shortness of breath or blurred vision between now and the time of your surgery.  16.  DO NOT shave your operative site 96 hours (4 days) prior to surgery.  For face and neck surgery, men may use an electric razor 48 hours (2 days) prior to surgery.  17.  Shower the night before surgery and the morning of surgery with  an antibacterial soap   or  Chlorhexidine gluconate (for total joint replacement).      To provide excellent care, visitors will be limited to two in a room at any given time.  Please no children under the age of 14 in the surgical department.

## 2021-06-06 NOTE — Progress Notes (Signed)
PAT completed with patient orders placed per MD, patient aware of 1100 arrival time on 06/17/2021 at main entrance [facility] states her spouse Richardson Landry will be driver and caregiver day of procedure.Silas Sacramento, RN

## 2021-06-17 ENCOUNTER — Inpatient Hospital Stay: Payer: Medicare (Managed Care) | Attending: Surgery

## 2021-06-17 ENCOUNTER — Ambulatory Visit: Admit: 2021-06-17 | Payer: PRIVATE HEALTH INSURANCE | Primary: Family Medicine

## 2021-06-17 LAB — COMPREHENSIVE METABOLIC PANEL W/ REFLEX TO MG FOR LOW K
ALT: 10 U/L (ref 10–40)
AST: 25 U/L (ref 15–37)
Albumin/Globulin Ratio: 1.6 (ref 1.1–2.2)
Albumin: 4.6 g/dL (ref 3.4–5.0)
Alkaline Phosphatase: 83 U/L (ref 40–129)
Anion Gap: 11 (ref 3–16)
BUN: 18 mg/dL (ref 7–20)
CO2: 24 mmol/L (ref 21–32)
Calcium: 10 mg/dL (ref 8.3–10.6)
Chloride: 102 mmol/L (ref 99–110)
Creatinine: 0.8 mg/dL (ref 0.6–1.2)
Est, Glom Filt Rate: >60 (ref >60)
Glucose: 99 mg/dL (ref 70–99)
Potassium reflex Magnesium: 4.2 mmol/L (ref 3.5–5.1)
Sodium: 137 mmol/L (ref 136–145)
Total Bilirubin: 0.4 mg/dL (ref 0.0–1.0)
Total Protein: 7.5 g/dL (ref 6.4–8.2)

## 2021-06-17 MED ORDER — IOPAMIDOL 51 % IV SOLN
51 % | INTRAVENOUS | Status: DC | PRN
Start: 2021-06-17 — End: 2021-06-17
  Administered 2021-06-17: 19:00:00 15 via INTRA_ARTICULAR

## 2021-06-17 MED ORDER — HEPARIN SODIUM (PORCINE) 5000 UNIT/ML IJ SOLN
5000 UNIT/ML | Freq: Once | INTRAMUSCULAR | Status: AC
Start: 2021-06-17 — End: 2021-06-17
  Administered 2021-06-17: 17:00:00 5000 [IU] via SUBCUTANEOUS

## 2021-06-17 MED ORDER — FENTANYL CITRATE (PF) 100 MCG/2ML IJ SOLN
100 MCG/2ML | INTRAMUSCULAR | Status: DC | PRN
Start: 2021-06-17 — End: 2021-06-17
  Administered 2021-06-17 (×2): 50 via INTRAVENOUS

## 2021-06-17 MED ORDER — LACTATED RINGERS IV SOLN
INTRAVENOUS | Status: DC
Start: 2021-06-17 — End: 2021-06-17
  Administered 2021-06-17: 16:00:00 via INTRAVENOUS

## 2021-06-17 MED ORDER — BUPIVACAINE HCL (PF) 0.5 % IJ SOLN
0.5 % | INTRAMUSCULAR | Status: DC | PRN
Start: 2021-06-17 — End: 2021-06-17
  Administered 2021-06-17: 19:00:00 30 via INTRADERMAL

## 2021-06-17 MED ORDER — INDOCYANINE GREEN 25 MG IV SOLR
25 MG | Freq: Once | INTRAVENOUS | Status: AC
Start: 2021-06-17 — End: 2021-06-17

## 2021-06-17 MED ORDER — NORMAL SALINE FLUSH 0.9 % IV SOLN
0.9 % | INTRAVENOUS | Status: DC | PRN
Start: 2021-06-17 — End: 2021-06-17

## 2021-06-17 MED ORDER — OXYCODONE-ACETAMINOPHEN 5-325 MG PO TABS
5-325 MG | ORAL_TABLET | Freq: Four times a day (QID) | ORAL | 0 refills | Status: AC | PRN
Start: 2021-06-17 — End: 2021-06-22

## 2021-06-17 MED ORDER — LIDOCAINE HCL 2 % IJ SOLN
2 % | INTRAMUSCULAR | Status: DC | PRN
Start: 2021-06-17 — End: 2021-06-17
  Administered 2021-06-17: 19:00:00 80 via INTRAVENOUS

## 2021-06-17 MED ORDER — BUPIVACAINE HCL (PF) 0.5 % IJ SOLN
0.5 % | INTRAMUSCULAR | Status: AC
Start: 2021-06-17 — End: ?

## 2021-06-17 MED ORDER — LACTATED RINGERS IV SOLN
INTRAVENOUS | Status: DC | PRN
Start: 2021-06-17 — End: 2021-06-17
  Administered 2021-06-17: 19:00:00 via INTRAVENOUS

## 2021-06-17 MED ORDER — SUGAMMADEX SODIUM 200 MG/2ML IV SOLN
200 MG/2ML | INTRAVENOUS | Status: DC | PRN
Start: 2021-06-17 — End: 2021-06-17
  Administered 2021-06-17: 19:00:00 200 via INTRAVENOUS

## 2021-06-17 MED ORDER — HYDROMORPHONE 0.5MG/0.5ML IJ SOLN
1 MG/ML | Status: DC | PRN
Start: 2021-06-17 — End: 2021-06-17

## 2021-06-17 MED ORDER — NORMAL SALINE FLUSH 0.9 % IV SOLN
0.9 % | Freq: Two times a day (BID) | INTRAVENOUS | Status: DC
Start: 2021-06-17 — End: 2021-06-17

## 2021-06-17 MED ORDER — HYDROMORPHONE 0.5MG/0.5ML IJ SOLN
1 MG/ML | Status: DC | PRN
Start: 2021-06-17 — End: 2021-06-17
  Administered 2021-06-17: 20:00:00 0.5 mg via INTRAVENOUS

## 2021-06-17 MED ORDER — PROPOFOL 200 MG/20ML IV EMUL
200 MG/20ML | INTRAVENOUS | Status: DC | PRN
Start: 2021-06-17 — End: 2021-06-17
  Administered 2021-06-17: 19:00:00 180 via INTRAVENOUS

## 2021-06-17 MED ORDER — INDOCYANINE GREEN 25 MG IV SOLR
25 MG | INTRAVENOUS | Status: AC
Start: 2021-06-17 — End: 2021-06-17
  Administered 2021-06-17: 17:00:00 5 via INTRAVENOUS

## 2021-06-17 MED ORDER — MIDAZOLAM HCL 2 MG/2ML IJ SOLN
2 MG/ML | INTRAMUSCULAR | Status: DC | PRN
Start: 2021-06-17 — End: 2021-06-17

## 2021-06-17 MED ORDER — FAMOTIDINE (PF) 20 MG/2ML IV SOLN
20 MG/2ML | Freq: Once | INTRAVENOUS | Status: AC
Start: 2021-06-17 — End: 2021-06-17
  Administered 2021-06-17: 16:00:00 20 mg via INTRAVENOUS

## 2021-06-17 MED ORDER — ONDANSETRON HCL 4 MG/2ML IJ SOLN
4 MG/2ML | INTRAMUSCULAR | Status: AC
Start: 2021-06-17 — End: 2021-06-17
  Administered 2021-06-17: 20:00:00 4 via INTRAVENOUS

## 2021-06-17 MED ORDER — HYDROMORPHONE 0.5MG/0.5ML IJ SOLN
1 MG/ML | Status: AC
Start: 2021-06-17 — End: 2021-06-17
  Administered 2021-06-17: 20:00:00 0.5 via INTRAVENOUS

## 2021-06-17 MED ORDER — ONDANSETRON HCL 4 MG/2ML IJ SOLN
4 MG/2ML | INTRAMUSCULAR | Status: DC | PRN
Start: 2021-06-17 — End: 2021-06-17
  Administered 2021-06-17: 19:00:00 4 via INTRAVENOUS

## 2021-06-17 MED ORDER — SODIUM CHLORIDE 0.9 % IV SOLN
0.9 % | INTRAVENOUS | Status: DC | PRN
Start: 2021-06-17 — End: 2021-06-17

## 2021-06-17 MED ORDER — FENTANYL CITRATE (PF) 100 MCG/2ML IJ SOLN
100 MCG/2ML | INTRAMUSCULAR | Status: AC
Start: 2021-06-17 — End: ?

## 2021-06-17 MED ORDER — DIPHENHYDRAMINE HCL 50 MG/ML IJ SOLN
50 MG/ML | Freq: Once | INTRAMUSCULAR | Status: DC | PRN
Start: 2021-06-17 — End: 2021-06-17

## 2021-06-17 MED ORDER — HYDRALAZINE HCL 20 MG/ML IJ SOLN
20 MG/ML | INTRAMUSCULAR | Status: DC | PRN
Start: 2021-06-17 — End: 2021-06-17

## 2021-06-17 MED ORDER — ONDANSETRON HCL 4 MG/2ML IJ SOLN
4 MG/2ML | INTRAMUSCULAR | Status: DC | PRN
Start: 2021-06-17 — End: 2021-06-17

## 2021-06-17 MED ORDER — KETOROLAC TROMETHAMINE 30 MG/ML IJ SOLN
30 MG/ML | INTRAMUSCULAR | Status: DC | PRN
Start: 2021-06-17 — End: 2021-06-17
  Administered 2021-06-17: 19:00:00 15 via INTRAVENOUS

## 2021-06-17 MED ORDER — OXYCODONE HCL 5 MG PO TABS
5 MG | Freq: Once | ORAL | Status: AC | PRN
Start: 2021-06-17 — End: 2021-06-17
  Administered 2021-06-17: 20:00:00 5 mg via ORAL

## 2021-06-17 MED ORDER — MEPERIDINE HCL 25 MG/ML IJ SOLN
25 MG/ML | INTRAMUSCULAR | Status: DC | PRN
Start: 2021-06-17 — End: 2021-06-17

## 2021-06-17 MED ORDER — ROCURONIUM BROMIDE 50 MG/5ML IV SOLN
50 MG/5ML | INTRAVENOUS | Status: DC | PRN
Start: 2021-06-17 — End: 2021-06-17
  Administered 2021-06-17: 19:00:00 50 via INTRAVENOUS

## 2021-06-17 MED FILL — FAMOTIDINE (PF) 20 MG/2ML IV SOLN: 20 MG/2ML | INTRAVENOUS | Qty: 2

## 2021-06-17 MED FILL — OXYCODONE HCL 5 MG PO TABS: 5 MG | ORAL | Qty: 1

## 2021-06-17 MED FILL — HYDROMORPHONE 0.5MG/0.5ML IJ SOLN: 1 MG/ML | Qty: 0.5

## 2021-06-17 MED FILL — FENTANYL CITRATE (PF) 100 MCG/2ML IJ SOLN: 100 MCG/2ML | INTRAMUSCULAR | Qty: 2

## 2021-06-17 MED FILL — MARCAINE PRESERVATIVE FREE 0.5 % IJ SOLN: 0.5 % | INTRAMUSCULAR | Qty: 30

## 2021-06-17 MED FILL — IC GREEN 25 MG IV SOLR: 25 MG | INTRAVENOUS | Qty: 10

## 2021-06-17 MED FILL — HEPARIN SODIUM (PORCINE) 5000 UNIT/ML IJ SOLN: 5000 UNIT/ML | INTRAMUSCULAR | Qty: 1

## 2021-06-17 MED FILL — ONDANSETRON HCL 4 MG/2ML IJ SOLN: 4 MG/2ML | INTRAMUSCULAR | Qty: 2

## 2021-06-17 NOTE — Anesthesia Pre-Procedure Evaluation (Signed)
Department of Anesthesiology  Preprocedure Note       Name:  Vicki Hurst   Age:  71 y.o.  DOB:  01-23-1950                                          MRN:  [id number]         Date:  06/17/2021      Surgeon: Moishe Spice):  Nance Pear, MD    Procedure: Procedure(s):  ROBOTIC CHOLECYSTECTOMY WITH CHOLANGIOGRAMS, POSSIBLE OPEN PROCEDURE  X    Medications prior to admission:   Prior to Admission medications    Medication Sig Start Date End Date Taking? Authorizing Provider   vitamin D (CHOLECALCIFEROL) 50 MCG (2000 UT) TABS tablet Take 1 tablet by mouth daily 03/18/21   Historical Provider, MD   alum Hydroxide-Mag Carbonate (GAVISCON EXTRA STRENGTH) 160-105 MG CHEW Take by mouth    Historical Provider, MD       Current medications:    Current Facility-Administered Medications   Medication Dose Route Frequency Provider Last Rate Last Admin   . famotidine (PEPCID) injection 20 mg  20 mg IntraVENous Once Tonie Griffith, MD       . lactated ringers IV soln infusion   IntraVENous Continuous Tonie Griffith, MD       . sodium chloride flush 0.9 % injection 5-40 mL  5-40 mL IntraVENous 2 times per day Tonie Griffith, MD       . sodium chloride flush 0.9 % injection 5-40 mL  5-40 mL IntraVENous PRN Tonie Griffith, MD       . 0.9 % sodium chloride infusion   IntraVENous PRN Tonie Griffith, MD           Allergies:    Allergies   Allergen Reactions   . Silver Rash     Mostly if wears jewelry causes blister    Possible the silver in medications and dyes   . Sulfa Antibiotics Swelling   . Azithromycin      Other reaction(s): rash, sob   . Ciprofloxacin      Other reaction(s): Other (See Comments)   . Keflex [Cephalexin] Swelling   . Macrobid [employer] Macro]    . Mupirocin Calcium      Nasal use   Sinus  dripage and severe burning   . Nickel    . Sulfa Antibiotics Hives       Problem List:    Patient Active Problem List   Diagnosis Code   . DVT (deep venous thrombosis)  (HCC) I82.409   . Anxiety F41.9   . Agranulocytosis secondary to cancer chemotherapy (CODE) (HCC) D70.1   . Cataracts, bilateral H26.9   . Diarrhea with dehydration R19.7   . Disorder of bone, unspecified M89.9   . Malignant neoplasm of lower-inner quadrant of female breast (HCC) C50.319   . Nausea with vomiting, unspecified R11.2   . Chest pain R07.9   . Calculus of gallbladder without cholecystitis without obstruction K80.20   . Elevated LFTs R79.89   . Hypertension I10       Past Medical History:        Diagnosis Date   . Breast cancer (HCC)    . Cancer (HCC) 08/24/12    breast cancer   . Cancer (HCC)     Breast   . Gastric reflux  Past Surgical History:        Procedure Laterality Date   . EYE SURGERY     . TUNNELED VENOUS PORT PLACEMENT         Social History:    Social History     Tobacco Use   . Smoking status: Never   . Smokeless tobacco: Never   Substance Use Topics   . Alcohol use: No                                Counseling given: Not Answered      Vital Signs (Current):   Vitals:    06/06/21 1508   Weight: 147 lb (66.7 kg)   Height: 5\' 8"  (1.727 m)                                              BP Readings from Last 3 Encounters:   06/02/21 (!) 187/88   05/31/21 (!) 164/70   02/15/17 (!) 167/101       NPO Status:                                                                                 BMI:   Wt Readings from Last 3 Encounters:   06/06/21 147 lb (66.7 kg)   06/02/21 156 lb (70.8 kg)   05/30/21 156 lb 8 oz (71 kg)     Body mass index is 22.35 kg/m.    CBC:   Lab Results   Component Value Date/Time    WBC 3.4 06/02/2021 10:47 AM    RBC 4.57 06/02/2021 10:47 AM    HGB 13.1 06/02/2021 10:47 AM    HCT 38.6 06/02/2021 10:47 AM    MCV 84.5 06/02/2021 10:47 AM    RDW 13.4 06/02/2021 10:47 AM    PLT 220 06/02/2021 10:47 AM       CMP:   Lab Results   Component Value Date/Time    NA 137 06/02/2021 10:47 AM    K 4.0 06/02/2021 10:47 AM    CL 103 06/02/2021 10:47 AM    CO2 24 06/02/2021 10:47 AM    BUN 18  06/02/2021 10:47 AM    CREATININE 0.7 06/02/2021 10:47 AM    GFRAA >60 02/15/2017 10:25 AM    AGRATIO 1.5 06/02/2021 10:47 AM    LABGLOM >60 06/02/2021 10:47 AM    GLUCOSE 100 06/02/2021 10:47 AM    PROT 7.5 06/02/2021 10:47 AM    CALCIUM 9.7 06/02/2021 10:47 AM    BILITOT 0.5 06/02/2021 10:47 AM    ALKPHOS 119 06/02/2021 10:47 AM    AST 73 06/02/2021 10:47 AM    ALT 146 06/02/2021 10:47 AM       POC Tests: No results for input(s): POCGLU, POCNA, POCK, POCCL, POCBUN, POCHEMO, POCHCT in the last 72 hours.    Coags:   Lab Results   Component Value Date/Time    PROTIME 12.3 05/30/2021 05:16 PM    INR 0.91 05/30/2021 05:16 PM  APTT 30.3 02/15/2017 10:25 AM       HCG (If Applicable): No results found for: PREGTESTUR, PREGSERUM, HCG, HCGQUANT     ABGs: No results found for: PHART, PO2ART, PCO2ART, HCO3ART, BEART, O2SATART     Type & Screen (If Applicable):  No results found for: LABABO, LABRH    Drug/Infectious Status (If Applicable):  No results found for: HIV, HEPCAB    COVID-19 Screening (If Applicable): No results found for: COVID19        Anesthesia Evaluation  Patient summary reviewed and Nursing notes reviewed no history of anesthetic complications:   Airway: Mallampati: II  TM distance: >3 FB   Neck ROM: full  Mouth opening: > = 3 FB   Dental: normal exam         Pulmonary:Negative Pulmonary ROS                              Cardiovascular:    (+) hypertension:,                   Neuro/Psych:   Negative Neuro/Psych ROS              GI/Hepatic/Renal: Neg GI/Hepatic/Renal ROS       (-) GERD, liver disease and no renal disease       Endo/Other:    (+) malignancy/cancer (breast).    (-) diabetes mellitus               Abdominal:             Vascular:   + DVT, .       Other Findings:           Anesthesia Plan      general     ASA 3     (I discussed with the patient the risks and benefits of PIV, general anesthesia, IV Narcotics, PACU. All questions were answered the patient agrees with the plan)  Induction:  intravenous.    MIPS: Prophylactic antiemetics administered.  Anesthetic plan and risks discussed with patient and spouse.      Plan discussed with CRNA.                    Betsy CoderOBERT STACY Ife Vitelli, MD   06/17/2021

## 2021-06-17 NOTE — Progress Notes (Signed)
Patient discharged via wheelchair in stable condition with all belongings to private car.Ranell Skibinski, RN

## 2021-06-17 NOTE — Anesthesia Post-Procedure Evaluation (Signed)
Department of Anesthesiology  Postprocedure Note    Patient: Vicki Hurst  MRN: [id number]  Birthdate: 1950/11/20  Date of evaluation: 06/17/2021      Procedure Summary     Date: 06/17/21 Room / Location: [facility] OR 05 / [facility]    Anesthesia Start: 1431 Anesthesia Stop: 1531    Procedure: ROBOTIC CHOLECYSTECTOMY WITH CHOLANGIOGRAMS, (Abdomen) Diagnosis:       Biliary calculus of other site without obstruction      (Biliary calculus of other site without obstruction [K80.80])    Surgeons: Nance Pear, MD Responsible Provider: Betsy Coder, MD    Anesthesia Type: general ASA Status: 3          Anesthesia Type: No value filed.    Aldrete Phase I: Aldrete Score: 10    Aldrete Phase II: Aldrete Score: 10      Anesthesia Post Evaluation    Patient location during evaluation: PACU  Level of consciousness: awake  Airway patency: patent  Nausea & Vomiting: no nausea  Complications: no  Cardiovascular status: blood pressure returned to baseline  Respiratory status: acceptable  Hydration status: euvolemic

## 2021-06-17 NOTE — Progress Notes (Signed)
Called and spoke to pt husband Richardson Landry. Discharge instructions explained in detail over the phone to pt husband. Pt received copy of discharge instructions and notified to pick up prescriptions x 1 at [facility]. Pt  and husband deny having any questions about discharge.

## 2021-06-17 NOTE — H&P (Signed)
I have reviewed the history and physical dated May/12/2021 and examined the patient and find no relevant changes.    I have reviewed with the patient and/or family the risks, benefits, and alternatives to the procedure.

## 2021-06-17 NOTE — Discharge Instructions (Addendum)
[facility]    David T. Ward, M.D.   [place] [facility]      [place] [facility]        [place] [facility]               [address]                [address]  Jeff Welshhans, M.D.              [address]           [address]          [place] [address]         [place] [address]  Brian M. Shiff, M.D                         [phone]         [phone]        [place] [facility]        [place]                   Mark T. Poynter, M.D.          [place] [facility]       POST-OPERATIVE INSTRUCTIONS FOR GALLBLADDER SURGERY    Call the office to schedule your post-operative appointment with your surgeon for two (2) weeks.     You will have either white steri-strips and a water occlusive dressing or surgical glue closing your incisions.    If you have clear bandages over your incisions, you may remove them in 3-4 days.  Leave the steri-stips in place. These will peel away in 7-10 days.     You may shower.  Wash incisions gently, and pat them dry. Do not rub your incisions.    General guidelines for activity:   Avoid strenuous activity or lifting anything heavier than 20 pounds.    It is OK to be up  walking around, and walking up and down stairs.     Do what is comfortable: stop and rest when you feel tired.   Drink plenty of fluids and stay on a bland diet for 2-3 days after surgery.     Do NOT drive while taking your narcotic pain medicine.     Watch for signs of infection:  Excessive warmth or bright redness around your incisions  Leakage cloudy fluid from you incisions  Fever over 101.5  During the laparoscopic procedure that you had, gas is pumped into the abdominal cavity.  You may feel abdominal, shoulder, or rib pain for a few days due to this gas.    You will have pain medicine ordered. Take as directed    If you experience constipation:  Increase your water intake  Increase your activity, walking is best.  A stool softener or mild laxative  may be necessary if you still have not had a bowel movement ; call the office for further instructions.    Please take note: IF you do not take all of your narcotic pain medication, we ask that you dispose of these responsibly.   Drug drop off boxes are located in the [place] [facility] and [place] [facility] emergency departments.   These Med Safe return cabinets are available 24/7 in the emergency department lobbies.   Hospital of office staff may NOT accept any medications to drop off in the cabinet.      ANESTHESIA DISCHARGE INSTRUCTIONS    You are under the influence of drugs- do not drink alcohol, drive a car, operate machinery(such as power tools, kitchen appliances, etc), sign legal documents, or make any important decisions for 24 hours (or while on pain medications).   Children should not ride bikes or skate boards or play on gym sets  for 24 hours after surgery.  A responsible adult should be with you for 24 hours.  Rest at home today- increase activity as tolerated.  Progress slowly to a regular diet unless your physician has instructed you otherwise. Drink plenty of water.    CALL YOUR DOCTOR IF YOU:  Have moderate to severe nausea or vomiting AND are unable to hold down fluids or prescribed medications.  Have bright red bloody drainage from your dressing that won't stop oozing.  Do not get relief with your pain medication    NORMAL (POSSIBLE) SIDE EFFECTS FROM ANESTHESIA:     Confusion, temporary memory loss, delayed reaction times in the first 24 hours  Lightheadedness, dizziness, difficulty focusing, blurred vision  Nausea/vomiting can happen  Shivering, feeling cold, sore throat, cough and muscle aches should stop within 24-48 hours  Trouble urinating - call your surgeon if it has been more than 8 hrs  Bruising or soreness at the IV site - call if it remains red, firm or there is drainage             FEMALES OF CHILDBEARING AGE WHO ARE TAKING BIRTH CONTROL PILLS:  You may have received a medication during  your procedure that interferes with the   actions of birth control pills (Bridion or Emend). Use some other kind of birth control in addition to your pills, like a condom, for 1 month after your procedure to prevent unwanted pregnancy.    The following instructions are to be followed if you have a known history or diagnosis of sleep apnea:  For all sleep apnea patients:  ? Sleep on your side or sitting up in a chair whenever possible, especially the first 24 hours after surgery.  ? Use only medicines prescribed by your doctor.    ? Do not drink alcohol.  ? If you have a dental device to assist you while at rest, use it at all times for the first 24 hours.  For patients using CPAP machines:  ? Use your CPAP machine during all periods of sleep as usual.  ? Use your CPAP machine during all periods of daytime rest while on pain medicines.  ** Follow up with your primary care doctor for continued care.    IF YOU DO NOT TAKE ALL OF YOUR NARCOTIC PAIN MEDICATION, please dispose of them responsibly. There are drop off boxes in the Emergency Departments 24/7 at both [place] [facility] and [facility]. If these locations are not convenient, other options for discarding them can be found at:  [email or web address]    Hospital or office staff may NOT accept any medications to drop off in the cabinet for you.

## 2021-06-17 NOTE — Op Note (Signed)
[facility]                 [address] [place], [place] [address]                                OPERATIVE REPORT    PATIENT NAME: Vicki Hurst, Vicki Hurst                    DOB:        05-14-1950  MED REC NO:   [id number]                          ROOM:  ACCOUNT NO:   [id number]                           ADMIT DATE: 06/17/2021  PROVIDER:     Orion Crook. Donnell Wion, MD    DATE OF PROCEDURE:  06/17/2021    PREOPERATIVE DIAGNOSIS:  Gallstones and elevated liver enzymes.    POSTOPERATIVE DIAGNOSIS:  Gallstones and elevated liver enzymes.    PROCEDURE:  Robotic cholecystectomy with intraoperative cholangiogram.    ANESTHESIA:  General.    SURGEON:  Orion Crook. Johnisha Louks, MD    ESTIMATED BLOOD LOSS:  Minimal.    INDICATIONS:  The patient is a 71 year old woman who was admitted with  abdominal pain and was found to have gallstones and elevated liver  enzymes.  She is now brought for interval cholecystectomy.    OPERATIVE SUMMARY:  After preoperative evaluation, the patient was  brought in the operating suite and placed in a comfortable supine  position on the operating room table.  Monitoring equipment was attached  and general anesthesia was induced.  Her abdomen was sterilely prepped  and draped, and a small incision was made at the inferior aspect of the  umbilicus.  This was dissected down to the fascia, and a suture of  0-Ethibond was placed on either side of the midline.  The midline fascia  was opened and the peritoneal cavity was entered directly.  A  figure-of-eight suture was placed across the defect for later closure.   A Hasson trocar was inserted, and the abdomen was insufflated to a  pressure of 15 mmHg.  The remaining ports were placed under direct  internal visualization.  She was placed in reverse Trendelenburg and  rotated to the left, and the robot was docked.  The gallbladder was  grasped and elevated cephalad over the liver edge.  The infundibular  structures were dissected out,  and the cystic duct and artery were  identified.  Each was tracked down to their origin, and the cystic duct  was clipped up adjacent to the gallbladder.  The cystic duct was  partially transected, and a cholangiogram catheter was inserted.  A  cholangiogram was performed that demonstrated free flow of contrast  throughout the biliary system without obstructions or filling defects.   The cholangiogram catheter was removed, and the cystic duct was doubly  clipped on the downside.  It was divided.  The cystic artery was  likewise clipped and divided.  The gallbladder was then dissected free  of the liver bed using electrocautery.  It was set aside and the  gallbladder fossa was examined.  There was no evidence of bleeding.   There was no bile leakage either by direct visualization or  Firefly  imaging.  The clips were intact in the proximal structures.  The  pneumoperitoneum was therefore let down, the robot was undocked, and the  trocars were removed.  The gallbladder was removed via the umbilical  fascial defect.  The umbilical fascial defect was then closed with the  previously placed figure-of-eight suture of 0-Ethibond.  Wounds were  injected with Marcaine, and the skin incisions closed with subcuticular  sutures of 4-0 Vicryl followed by Benzoin, Steri-Strips, and dry sterile  dressings.  All sponge, needle, and instrument counts were correct at  the end of the case.  The patient tolerated the procedure well.  She was  taken to the recovery area in stable condition.        Gareth Morgan, MD    D: 06/17/2021 15:32:30       T: 06/17/2021 15:37:07     [id number]  Job#: [id number]     Doc#: [id number]    CC:  Richardson Landry, MD

## 2021-06-17 NOTE — Progress Notes (Signed)
Patient admitted from OR to PACU. Bedside report received.  Patient immediately hooked up to heart  monitor.Mikey Kirschner, RN

## 2021-06-17 NOTE — Progress Notes (Signed)
Pre-Operative:  1.  Patient/Caregiver identifies - states name and date of birth.  2.  The patient is free from signs and symptoms of injury.  3.  The patient receives appropriate medication(s), safely administered during the Perioperative period.  4.  The patient is free from signs and symptoms of infection.  5.  The patient has wound / tissue perfusion.  6.  The patients's fluid, electrolyte, and acid-base balances are established preoperatively.  7.  The patient's pulmonary function is established preoperatively.  8.  The patient's cardiovascular status is established preoperatively.  9.  The patient / caregiver demonstrates knowledge of nutritional management related to the operative or other invasive procedure.  10.  The patient/caregiver demonstrates knowledge of medication management.  11.  The patient/caregiver demonstrates knowledge of pain management.  12.  The patient participates in the rehabilitation process as applicable.  13.  The patient/caregiver participates in decisions affection his or her Perioperative plan of care.  14.  The patient's care is consistent with the individualized Perioperative plan of care.  15.  The patient's right to privacy is maintained.  16.  The patient is the recipient of competent and ethical care within legal standards of practice.  17.  The patient's value system, lifestyle, ethnicity, and culture are considered, respected, and incorporated in the Perioperative plan of care and understands special services available.  18.  The patient demonstrates and/or reports adequate pain control throughout the the Perioperative period.  19.  The patient's neurological status is established preoperatively.  20.  The patient/caregiver demonstrates knowledge of the expected responses to the operative or invasive procedure.  21.  Patient/Caregiver has reduced anxiety.  Interventions- Familiarize with environment and equipment.  22. Patient/Caregiver verbalizes understanding of Phase I  and Phase II process.  23.  Patient pain level is established preoperatively using age appropriate pain scale.  24.  The patient will move to fall risk upon sedation- during and through the recovery phase.  Interventions- orient the patient to the environment, especially the location of the bathroom; provide treaded socks/non-skid footwear; demonstrate and teach back use of the nurse's call system; instruct the patient to call for help before getting out of bed; lock all movable equipment before transferring patient; keep bed in lowest position possible. 25.  Other:

## 2021-06-17 NOTE — Progress Notes (Signed)
Discharge instruction given to patient.  Patient denies any questions at this time.Mikey Kirschner, RN

## 2021-06-17 NOTE — Brief Op Note (Signed)
Brief Postoperative Note      Patient: Vicki Hurst  Date of Birth: [birth year]  MRN: [id number]    Date of Procedure: Jun 22, 2021    Pre-Op Diagnosis Codes:     * Biliary calculus of other site without obstruction [K80.80]    Post-Op Diagnosis: Same       Procedure(s):  ROBOTIC CHOLECYSTECTOMY WITH CHOLANGIOGRAMS,    Surgeon(s):  Nance Pear, MD    Assistant:  Surgical Assistant: Letitia Caul    Anesthesia: General    Estimated Blood Loss (mL): Minimal    Complications: None    Specimens:   ID Type Source Tests Collected by Time Destination   A : gallbladder and contents Tissue Tissue SURGICAL PATHOLOGY Nance Pear, MD Jun 22, 2021 1455        Implants:  * No implants in log *      Drains: * No LDAs found *    Findings: as above      Electronically signed by Nance Pear, MD on June 22, 2021 at 3:13 PM

## 2021-07-01 ENCOUNTER — Institutional Professional Consult (permissible substitution): Admit: 2021-07-01 | Discharge: 2021-07-01 | Payer: MEDICARE | Attending: Surgery | Primary: Family Medicine

## 2021-07-01 DIAGNOSIS — Z9889 Other specified postprocedural states: Secondary | ICD-10-CM

## 2021-07-01 NOTE — Progress Notes (Signed)
[facility]      S:   Patient presents s/p robotic cholecystectomy with cholangiogram.  She reports doing well.    O:   Comfortable         Incision sites healing well.              A:   S/P robotic cholecystectomy with cholangiogram    P:   Follow up as needed.

## 2023-12-29 ENCOUNTER — Inpatient Hospital Stay
Admit: 2023-12-29 | Discharge: 2023-12-29 | Disposition: A | Discharge status: Home / Self Care | Payer: Medicare (Managed Care)

## 2023-12-29 ENCOUNTER — Emergency Department: Admit: 2023-12-29 | Payer: Medicare (Managed Care) | Primary: Family Medicine

## 2023-12-29 DIAGNOSIS — R197 Diarrhea, unspecified: Secondary | ICD-10-CM

## 2023-12-29 LAB — URINALYSIS WITH MICROSCOPIC
Bilirubin, Urine: NEGATIVE
Blood, Urine: NEGATIVE
Glucose, Ur: NEGATIVE mg/dL
Ketones, Urine: NEGATIVE mg/dL
Leukocyte Esterase, Urine: NEGATIVE
Nitrite, Urine: NEGATIVE
Protein, UA: NEGATIVE mg/dL
RBC, UA: NONE SEEN /HPF (ref 0–4)
Specific Gravity, UA: 1.015 (ref 1.005–1.030)
Urobilinogen, Urine: 0.2 U/dL (ref <2.0)
pH, Urine: 6 (ref 5.0–8.0)

## 2023-12-29 LAB — COMPREHENSIVE METABOLIC PANEL W/ REFLEX TO MG FOR LOW K
ALT: 10 U/L (ref 10–40)
AST: 47 U/L — ABNORMAL HIGH (ref 15–37)
Albumin/Globulin Ratio: 1.1 (ref 1.1–2.2)
Albumin: 3.8 g/dL (ref 3.4–5.0)
Alkaline Phosphatase: 107 U/L (ref 40–129)
Anion Gap: 9 (ref 3–16)
BUN: 16 mg/dL (ref 7–20)
CO2: 24 mmol/L (ref 21–32)
Calcium: 9 mg/dL (ref 8.3–10.6)
Chloride: 105 mmol/L (ref 99–110)
Creatinine: 0.7 mg/dL (ref 0.6–1.2)
Est, Glom Filt Rate: >90
Glucose: 93 mg/dL (ref 70–99)
Potassium reflex Magnesium: 4.3 mmol/L (ref 3.5–5.1)
Sodium: 138 mmol/L (ref 136–145)
Total Bilirubin: 0.3 mg/dL (ref 0.0–1.0)
Total Protein: 7.4 g/dL (ref 6.4–8.2)

## 2023-12-29 LAB — CBC WITH AUTO DIFFERENTIAL
Basophils %: 1.3 %
Basophils Absolute: 0.1 K/uL (ref 0.0–0.2)
Eosinophils %: 19.4 %
Eosinophils Absolute: 1.2 K/uL — ABNORMAL HIGH (ref 0.0–0.6)
Hematocrit: 38.3 % (ref 36.0–48.0)
Hemoglobin: 13.1 g/dL (ref 12.0–16.0)
Lymphocytes %: 24.6 %
Lymphocytes Absolute: 1.6 K/uL (ref 1.0–5.1)
MCH: 28.4 pg (ref 26.0–34.0)
MCHC: 34.3 g/dL (ref 31.0–36.0)
MCV: 82.8 fL (ref 80.0–100.0)
MPV: 7.6 fL (ref 5.0–10.5)
Monocytes %: 6.5 %
Monocytes Absolute: 0.4 K/uL (ref 0.0–1.3)
Neutrophils %: 48.2 %
Neutrophils Absolute: 3.1 K/uL (ref 1.7–7.7)
Platelets: 280 K/uL (ref 135–450)
RBC: 4.63 M/uL (ref 4.00–5.20)
RDW: 13.3 % (ref 12.4–15.4)
WBC: 6.4 K/uL (ref 4.0–11.0)

## 2023-12-29 LAB — TSH REFLEX TO FT4: TSH Reflex FT4: 1.08 u[IU]/mL (ref 0.27–4.20)

## 2023-12-29 LAB — TROPONIN: Troponin, High Sensitivity: <6 ng/L (ref 0–14)

## 2023-12-29 MED ORDER — KETOROLAC TROMETHAMINE 15 MG/ML IJ SOLN
15 | Freq: Once | INTRAMUSCULAR | Status: AC
Start: 2023-12-29 — End: 2023-12-29
  Administered 2023-12-29: 19:00:00 15 mg via INTRAVENOUS

## 2023-12-29 MED ORDER — SODIUM CHLORIDE 0.9 % IV BOLUS
0.9 | Freq: Once | INTRAVENOUS | Status: AC
Start: 2023-12-29 — End: 2023-12-29
  Administered 2023-12-29: 19:00:00 1000 mL via INTRAVENOUS

## 2023-12-29 MED ORDER — IOPAMIDOL 76 % IV SOLN
76 | Freq: Once | INTRAVENOUS | Status: AC | PRN
Start: 2023-12-29 — End: 2023-12-29
  Administered 2023-12-29: 20:00:00 50 mL via INTRAVENOUS

## 2023-12-29 MED FILL — KETOROLAC TROMETHAMINE 15 MG/ML IJ SOLN: 15 mg/mL | INTRAMUSCULAR | Qty: 1

## 2023-12-29 NOTE — ED Notes (Signed)
"  Pt unsure if she would be able to provide a stool sample due to taking multiple doses of lomotil today.   "

## 2023-12-29 NOTE — ED Notes (Signed)
"  Pt asked if she could provide urine and stool sample and states she would try after receiving IV fluids   "

## 2023-12-29 NOTE — Progress Notes (Signed)
 Vicki Hurst

## 2023-12-29 NOTE — ED Provider Notes (Signed)
 "    [facility] EMERGENCY DEPARTMENT  EMERGENCY DEPARTMENT ENCOUNTER        Pt Name: Vicki Hurst  MRN: [id number]  Birthdate 05-08-1950  Date of evaluation: 12/29/2023  Provider: Damien JINNY Neighbor, PA-C  PCP: Tina Sharolyn POUR, MD  Note Started: 1:58 PM EST 12/29/23      APP. I have evaluated this patient.        CHIEF COMPLAINT       Chief Complaint   Patient presents with    Diarrhea     States that she has IBS and that she has been taking 4 Lomotil a day since thanksgiving with no relief       HISTORY OF PRESENT ILLNESS: 1 or more Elements     History From: Patient    Limitations to history : None    Social Determinants Significantly Affecting Health : None    Chief Complaint: Diarrhea    Vicki Hurst is a 73 y.o. female with a PMHx of breast cancer, DVT, IBS, HTN, who presents to the ED with persistent excessive nonbloody nonmucoid diarrhea x 3 weeks.  She had a brief period where diarrhea improved slightly and was responsive to Lomotil, but then this past week worsened significantly again despite continued Lomotil use.  She has associated abdominal cramping on both sides, as well as some abdominal burning.  Patient also notes some increased indigestion lately, but states that this is pretty consistent with her baseline ever since having her gallbladder removed 2 years ago.  Patient does report that she was diagnosed with the flu 1 week ago, and took 3 doses of amoxicillin that she had.  Patient endorses feeling generally weak and shaky.  Denies fevers, chest pain, shortness of breath, flank pain, urinary symptoms, paresthesias, peripheral edema, rashes.     Nursing Notes were all reviewed and agreed with or any disagreements were addressed in the HPI.    REVIEW OF SYSTEMS :      Review of Systems    Positives and Pertinent negatives as per HPI.     SURGICAL HISTORY     Past Surgical History:   Procedure Laterality Date    CHOLECYSTECTOMY, LAPAROSCOPIC N/A 06/17/2021    ROBOTIC CHOLECYSTECTOMY WITH  CHOLANGIOGRAMS, performed by Redell Clorinda Canavan, MD at [facility] OR    EYE SURGERY      TUNNELED VENOUS PORT PLACEMENT         CURRENTMEDICATIONS       Discharge Medication List as of 12/29/2023  4:55 PM        CONTINUE these medications which have NOT CHANGED    Details   estradiol (ESTRACE) 0.1 MG/GM vaginal cream APPLY A PEA-SIZE AMOUNT TO URETHRA EVERY 3 DAYSHistorical Med      vitamin D (CHOLECALCIFEROL) 50 MCG (2000 UT) TABS tablet Take 1 tablet by mouth dailyHistorical Med      alum Hydroxide-Mag Carbonate (GAVISCON EXTRA STRENGTH) 160-105 MG CHEW Take by mouthHistorical Med             ALLERGIES     Silver, Sulfa antibiotics, Azithromycin, Ciprofloxacin , Keflex [cephalexin], Macrobid [nitrofurantoin monohyd macro], Mupirocin calcium, Nickel, and Sulfa antibiotics    FAMILYHISTORY     History reviewed. No pertinent family history.     SOCIAL HISTORY       Social History     Tobacco Use    Smoking status: Never    Smokeless tobacco: Never   Vaping Use    Vaping status: Never  Used   Substance Use Topics    Alcohol use: No    Drug use: No       SCREENINGS          Glasgow Coma Scale  Eye Opening: Spontaneous  Best Verbal Response: Oriented  Best Motor Response: Obeys commands  Glasgow Coma Scale Score: 15              CIWA Assessment  BP: (!) 196/80 (PA at beside and aware of BP)  Pulse: 88             PHYSICAL EXAM  1 or more Elements     ED Triage Vitals [12/29/23 1321]   BP Girls Systolic BP Percentile Girls Diastolic BP Percentile Boys Systolic BP Percentile Boys Diastolic BP Percentile Temp Temp src Pulse   (!) 170/77 -- -- -- -- 98.1 F (36.7 C) -- 82      Respirations SpO2 Height Weight - Scale       18 99 % 1.727 m (5' 8) 69.3 kg (152 lb 12.8 oz)           Physical Exam  Vitals reviewed.   Constitutional:       General: She is not in acute distress.     Appearance: She is not ill-appearing, toxic-appearing or diaphoretic.   HENT:      Mouth/Throat:      Mouth: Mucous membranes are moist.   Eyes:       General: No scleral icterus.  Cardiovascular:      Rate and Rhythm: Normal rate.      Pulses: Normal pulses.           Radial pulses are 2+ on the right side and 2+ on the left side.        Posterior tibial pulses are 2+ on the right side and 2+ on the left side.      Heart sounds: Normal heart sounds. No murmur heard.     No friction rub. No gallop.   Pulmonary:      Effort: Pulmonary effort is normal. No respiratory distress.      Breath sounds: Normal breath sounds. No stridor. No wheezing, rhonchi or rales.   Abdominal:      General: There is no distension.      Palpations: Abdomen is soft.      Tenderness: There is abdominal tenderness (Diffuse). There is no right CVA tenderness, left CVA tenderness, guarding or rebound.   Musculoskeletal:      Right lower leg: No edema.      Left lower leg: No edema.   Skin:     General: Skin is warm and dry.      Capillary Refill: Capillary refill takes less than 2 seconds.      Coloration: Skin is not jaundiced or pale.      Findings: No bruising, erythema, lesion or rash.   Neurological:      Mental Status: She is alert and oriented to person, place, and time. Mental status is at baseline.      Motor: No weakness (Generalized, but no focal deficit).           DIAGNOSTIC RESULTS   LABS:    Labs Reviewed   CBC WITH AUTO DIFFERENTIAL - Abnormal; Notable for the following components:       Result Value    Eosinophils Absolute 1.2 (*)     All other components within normal limits   COMPREHENSIVE METABOLIC PANEL W/ REFLEX  TO MG FOR LOW K - Abnormal; Notable for the following components:    AST 47 (*)     All other components within normal limits   URINALYSIS WITH MICROSCOPIC - Abnormal; Notable for the following components:    Mucus, UA Rare (*)     Bacteria, UA Rare (*)     Crystals, UA Few Ca. Oxalate (*)     All other components within normal limits   TROPONIN   TSH REFLEX TO FT4       When ordered only abnormal lab results are displayed. All other labs were within normal  range or not returned as of this dictation.    EKG: When ordered, EKG's are interpreted by the Emergency Department Physician in the absence of a cardiologist.  Please see their note for interpretation of EKG.    RADIOLOGY:   Non-plain film images such as CT, Ultrasound and MRI are read by the radiologist. Plain radiographic images are visualized and preliminarily interpreted by the ED Provider with the below findings:      N/a    Interpretation per the Radiologist below, if available at the time of this note:    CT ABDOMEN PELVIS W IV CONTRAST Additional Contrast? None   Final Result   1. Liquid stool in the colon consistent with diarrheal illness.   2. No acute findings elsewhere in the abdomen or pelvis.   3. Colonic diverticulosis.   4. Small calcifications or stones in the bladder.   5. Stable 4 mm left lower lobe nodule demonstrates over 2 years of stability   consistent with a benign nodule.  There are additional small nodules in the   lower lobes measuring up to 4 mm which are above the field of view previously   in indeterminate.  No routine follow-up is recommended for nodules of this   size.  In high risk patients, a 1 year follow-up unenhanced chest CT is   optional.   6. Punctate left intrarenal calculus.   7. Pelvic floor insufficiency with a cystocele.      RECOMMENDATIONS:   Lung nodule Fleischner Society guidelines for follow-up and management of   incidentally detected pulmonary nodules:      Multiple Solid Nodules:      ~Nodule size less than 6 mm.  In a low-risk patient, no routine follow-up.   In a high-risk patient, optional CT at 12 months.      Radiology 2017 http://pubs.https://www.stafford-myers.[email or web address]           No results found.     No results found.    PROCEDURES   Unless otherwise noted below, none     Procedures    CRITICAL CARE TIME (.cctime)   I independently provided 0 minutes out of the total shared critical care time, excluding separately billable procedures.        EMERGENCY DEPARTMENT COURSE and DIFFERENTIAL DIAGNOSIS/MDM:   Vitals:    Vitals:    12/29/23 1321 12/29/23 1411 12/29/23 1542 12/29/23 1640   BP: (!) 170/77 (!) 161/74 (!) 183/78 (!) 196/80   Pulse: 82 77 84 88   Resp: 18 16 18 16    Temp: 98.1 F (36.7 C)   97.9 F (36.6 C)   TempSrc:    Oral   SpO2: 99% 97% 100% 97%   Weight: 69.3 kg (152 lb 12.8 oz)      Height: 1.727 m (5' 8)          Patient was given the following  medications:  Medications   sodium chloride  0.9 % bolus 1,000 mL (0 mLs IntraVENous Stopped 12/29/23 1535)   ketorolac  (TORADOL ) injection 15 mg (15 mg IntraVENous Given 12/29/23 1417)   iopamidol  (ISOVUE -370) 76 % injection 50 mL (50 mLs IntraVENous Given 12/29/23 1516)               Sepsis:  Is this patient to be included in the SEP-1 Core Measure due to severe sepsis or septic shock?   No   Exclusion criteria - the patient is NOT to be included for SEP-1 Core Measure due to:  2+ SIRS criteria are not met     Chronic Conditions affecting care: none   has a past medical history of Breast cancer (HCC), Cancer (HCC) (08/24/12), Cancer (HCC), and Gastric reflux.    CONSULTS: (Who and What was discussed)  None    CC/HPI Summary, DDx, ED Course, and Reassessment:   SCOTT FIX is a 73 y.o. female with IBS presenting with persistent nonbloody nonmucoid diarrhea x3 weeks, that has increased in the past week. She has been taking Lomotil, symptoms can mostly controlled by this but are occasionally refractory. Recently diagnosed with flu 1 week ago and took 3 doses of amoxicillin. Endorses generalized weakness. Abdomen diffusely TTP but soft, nondistended, no CVA tenderness. Neurovascularly intact, good peripheral perfusion.  Afebrile, A&Ox3, in no acute distress, nontoxic appearing. Supportive care provided, including oral/IV rehydration, pain management (TORADOL ).    Workup overall unremarkable. No leukocytosis, and she is not meeting SIRS criteria, which is reassuring. Renal function stable, no  AKI. No infection on UA. Liver function consistent with baseline. TSH is WNL, and no hx of hyperthyroidism which is reassuring. No evidence of hepatobiliary processes. Troponin negative, lower suspicion for atypical ACS. CT demonstrated evidence of liquid stool in the colon consistent with diarrheal illness, but was otherwise nonacute.  Additionally, there are a few small nodules in the lower lung lobes, but only require follow-up if patient is high risk.  Additionally, there is evidence of pelvic floor insufficiency with a cystocele Discussed results, including all incidental findings on CT imaging. Stool studies were initially ordered as patient reports multiple loose stools per hour, but I am later notified that patient is unable to produce a sample, likely 2/2 the Lomotil she took PTA. Outpatient stool studies ordered instead.     No clear cause for her symptoms given hx of IBS, this is favored to be a primary contributing factor. Did consider empiric antibiotic use, given her age and concern for dehydration, however, she is afebrile, has no leukocytosis, is overall not meeting SIRS criteria, is not immunocompromised, etc, thus ABX are not indicated. No evidence of severe dehydration, systemic toxicity or other red flag features warranting admission are identified, and patient is hemodynamically stable, thus deemed appropriate for discharge with supportive care, ED return precautions, and follow up with GI (referral info given), and PCP.     Disposition Considerations (tests considered but not done, Admit vs D/C, Shared Decision Making, Pt Expectation of Test or Tx.):   Workup and management guided by HPI, exam, clinical severity and presence/absence of red flag features (fever, bloody stools, severe pain, dehydration, immunosuppression, recent hospitalization, recent ABX exposure).     Based on HPI, physical exam, and work up, considered infectious gastroenteritis (viral vs bacterial vs parasitic), food  poisoning, drug-induced diarrhea and/or C.diff colitis (recent ABX exposure), inflammatory bowel disease, ischemic colitis, functional diarrhea/IBS. Although patient does report recent ABX exposure this week ago,  symptom onset was well before this , so lower suspicion for ABX-induced C.Diff.     The patient tolerated their visit well.  The patient and / or the family were informed of the results of any tests, a time was given to answer questions, a plan was proposed and they agreed with plan.    I am the Primary Clinician of Record.  FINAL IMPRESSION      1. Diarrhea, unspecified type          DISPOSITION/PLAN     DISPOSITION Decision To Discharge 12/29/2023 04:52:49 PM   DISPOSITION CONDITION Stable           PATIENT REFERRED TO:  Tina Sharolyn POUR, MD  [address]  [address]  [place] [address]  [phone]    Schedule an appointment as soon as possible for a visit   Follow up    [facility] Emergency Department  [address]  [place]  [phone]  [phone]  Go to   As needed, If symptoms worsen    [facility]  [address]  [address]  [place]  [address]  [phone]  Schedule an appointment as soon as possible for a visit   Follow up      DISCHARGE MEDICATIONS:  Discharge Medication List as of 12/29/2023  4:55 PM          DISCONTINUED MEDICATIONS:  Discharge Medication List as of 12/29/2023  4:55 PM                 (Please note that portions of this note were completed with a voice recognition program.  Efforts were made to edit the dictations but occasionally words are mis-transcribed.)    Damien JINNY Neighbor, PA-C (electronically signed)      Neighbor Damien JINNY, PA-C  12/31/23 1811    "

## 2023-12-29 NOTE — ED Notes (Signed)
"  PA in room updating pt   "

## 2023-12-29 NOTE — Discharge Instructions (Addendum)
"  You were evaluated in the emergency department for your symptoms.  Overall your workup looked really good.  I have sent an order for stool studies to be obtained in the outpatient setting.  Please make sure you are staying hydrated at home.    Symptom Control at Home- Take all your regular medications as prescribed. Nonsteroidal anti-inflammatory drugs (NSAIDs) like ibuprofen and/or Tylenol /Acetaminophen  are usually recommended for pain control.    Follow up - Please follow up with your primary care provider within one week. If you do not have a PCP, I have included referral information for you to establish care.  Please follow-up with your GI provider as well.    When to Seek Immediate Medical Attention -   Signs of infection such as fever (temperature over 100.68F/38C) or chills, increased fatigue, feeling very unwell  Chest Pain, or palpitations  Inability to keep fluids down or Signs of dehydration (such as dry mouth, decreased urination, or dizziness)  Shortness of Breath  Dizziness or passing out  Numbness/tingling or weakness   Persistent vomiting or diarrhea, or blood in your vomit, stool or urine. (Blood in vomit may be bright red or look like coffee grounds, blood in your stool may be bright red or your stool may look black and tarry)  Confusion  Or any new, worsening or persistent symptoms you are concerned about          THANK YOU for allowing me and the rest of the Emergency Department staff to care for you. We hope that your care has been nothing short of outstanding. In the near future you may receive a survey in the mail about your visit.  If you receive a survey about your visit, we would truly appreciate you taking a few moments to share your feedback so that we can continue to provide you with EXCELLENT care.  We are committed to providing compassionate, high-quality care to every patient. Your voice helps us  recognize great care and improve where needed.    Wishing you a speedy recovery,    Damien JINNY Neighbor, PA-C   Emergency Physician Assistant       "

## 2023-12-30 ENCOUNTER — Inpatient Hospital Stay: Payer: Medicare (Managed Care) | Primary: Family Medicine

## 2023-12-30 DIAGNOSIS — R197 Diarrhea, unspecified: Principal | ICD-10-CM

## 2023-12-30 LAB — C DIFF TOXIN/ANTIGEN: C.diff Toxin/Antigen: NEGATIVE

## 2023-12-30 LAB — BLOOD OCCULT STOOL SCREEN #1: Occult Blood Screening: NEGATIVE

## 2023-12-31 LAB — GIARDIA ANTIGEN: Giardia Ag, Stl: NEGATIVE

## 2024-01-02 LAB — ROTAVIRUS ANTIGEN, STOOL: Rotavirus: NEGATIVE
